# Patient Record
Sex: Female | Born: 1990 | Race: Black or African American | Hispanic: No | Marital: Single | Smoking: Never smoker
Health system: Southern US, Community
[De-identification: ages and names within clinical notes are randomized; demographics above are authoritative.]

## PROBLEM LIST (undated history)

## (undated) DIAGNOSIS — F32A Depression, unspecified: Secondary | ICD-10-CM

## (undated) DIAGNOSIS — F329 Major depressive disorder, single episode, unspecified: Secondary | ICD-10-CM

## (undated) DIAGNOSIS — IMO0002 Reserved for concepts with insufficient information to code with codable children: Secondary | ICD-10-CM

## (undated) DIAGNOSIS — R51 Headache: Secondary | ICD-10-CM

## (undated) HISTORY — PX: WISDOM TOOTH EXTRACTION: SHX21

---
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
## 2011-05-18 ENCOUNTER — Emergency Department (HOSPITAL_COMMUNITY)
Admission: EM | Admit: 2011-05-18 | Discharge: 2011-05-19 | Disposition: A | Discharge status: Home / Self Care | Payer: BC Managed Care – PPO | Attending: Emergency Medicine | Admitting: Emergency Medicine

## 2011-05-18 ENCOUNTER — Emergency Department (HOSPITAL_COMMUNITY): Payer: BC Managed Care – PPO

## 2011-05-18 DIAGNOSIS — S61409A Unspecified open wound of unspecified hand, initial encounter: Secondary | ICD-10-CM | POA: Insufficient documentation

## 2011-05-18 DIAGNOSIS — F3289 Other specified depressive episodes: Secondary | ICD-10-CM | POA: Insufficient documentation

## 2011-05-18 DIAGNOSIS — X789XXA Intentional self-harm by unspecified sharp object, initial encounter: Secondary | ICD-10-CM | POA: Insufficient documentation

## 2011-05-18 DIAGNOSIS — F329 Major depressive disorder, single episode, unspecified: Secondary | ICD-10-CM | POA: Insufficient documentation

## 2011-05-18 DIAGNOSIS — R45851 Suicidal ideations: Secondary | ICD-10-CM | POA: Insufficient documentation

## 2011-05-18 LAB — URINALYSIS, ROUTINE W REFLEX MICROSCOPIC
Glucose, UA: NEGATIVE mg/dL
Hgb urine dipstick: NEGATIVE
Ketones, ur: 40 mg/dL — AB
Nitrite: NEGATIVE
Protein, ur: 30 mg/dL — AB
Specific Gravity, Urine: 1.03 (ref 1.005–1.030)
Urobilinogen, UA: 1 mg/dL (ref 0.0–1.0)
pH: 6 (ref 5.0–8.0)

## 2011-05-18 LAB — DIFFERENTIAL
Basophils Absolute: 0 10*3/uL (ref 0.0–0.1)
Basophils Relative: 1 % (ref 0–1)
Eosinophils Absolute: 0 10*3/uL (ref 0.0–0.7)
Eosinophils Relative: 0 % (ref 0–5)
Lymphocytes Relative: 24 % (ref 12–46)
Lymphs Abs: 1.2 10*3/uL (ref 0.7–4.0)
Monocytes Absolute: 0.4 10*3/uL (ref 0.1–1.0)
Monocytes Relative: 8 % (ref 3–12)
Neutro Abs: 3.6 10*3/uL (ref 1.7–7.7)
Neutrophils Relative %: 68 % (ref 43–77)

## 2011-05-18 LAB — RAPID URINE DRUG SCREEN, HOSP PERFORMED
Amphetamines: NOT DETECTED
Barbiturates: NOT DETECTED
Benzodiazepines: NOT DETECTED
Cocaine: NOT DETECTED
Opiates: NOT DETECTED
Tetrahydrocannabinol: NOT DETECTED

## 2011-05-18 LAB — URINE MICROSCOPIC-ADD ON

## 2011-05-18 LAB — POCT I-STAT, CHEM 8
BUN: 12 mg/dL (ref 6–23)
Calcium, Ion: 1.11 mmol/L — ABNORMAL LOW (ref 1.12–1.32)
Chloride: 109 mEq/L (ref 96–112)
Creatinine, Ser: 0.7 mg/dL (ref 0.50–1.10)
Glucose, Bld: 92 mg/dL (ref 70–99)
HCT: 42 % (ref 36.0–46.0)
Hemoglobin: 14.3 g/dL (ref 12.0–15.0)
Potassium: 3.5 mEq/L (ref 3.5–5.1)
Sodium: 140 mEq/L (ref 135–145)
TCO2: 19 mmol/L (ref 0–100)

## 2011-05-18 LAB — CBC
HCT: 37.9 % (ref 36.0–46.0)
Hemoglobin: 12.5 g/dL (ref 12.0–15.0)
MCH: 28.4 pg (ref 26.0–34.0)
MCHC: 33 g/dL (ref 30.0–36.0)
MCV: 86.1 fL (ref 78.0–100.0)
Platelets: 242 10*3/uL (ref 150–400)
RBC: 4.4 MIL/uL (ref 3.87–5.11)
RDW: 13.4 % (ref 11.5–15.5)
WBC: 5.2 10*3/uL (ref 4.0–10.5)

## 2011-05-18 LAB — PREGNANCY, URINE: Preg Test, Ur: NEGATIVE

## 2011-05-18 LAB — ETHANOL: Alcohol, Ethyl (B): <11 mg/dL (ref 0–11)

## 2011-06-25 ENCOUNTER — Ambulatory Visit (HOSPITAL_COMMUNITY): Payer: BC Managed Care – PPO | Admitting: Psychology

## 2011-07-23 ENCOUNTER — Ambulatory Visit (HOSPITAL_COMMUNITY): Payer: BC Managed Care – PPO | Admitting: Psychology

## 2011-07-27 DIAGNOSIS — IMO0002 Reserved for concepts with insufficient information to code with codable children: Secondary | ICD-10-CM

## 2011-07-27 DIAGNOSIS — IMO0001 Reserved for inherently not codable concepts without codable children: Secondary | ICD-10-CM

## 2011-07-27 HISTORY — DX: Reserved for concepts with insufficient information to code with codable children: IMO0002

## 2011-07-27 HISTORY — DX: Reserved for inherently not codable concepts without codable children: IMO0001

## 2011-08-10 ENCOUNTER — Ambulatory Visit (HOSPITAL_COMMUNITY): Payer: BC Managed Care – PPO | Admitting: Psychology

## 2011-08-12 ENCOUNTER — Other Ambulatory Visit: Payer: Self-pay | Admitting: Orthopedic Surgery

## 2011-08-12 ENCOUNTER — Encounter (HOSPITAL_BASED_OUTPATIENT_CLINIC_OR_DEPARTMENT_OTHER): Payer: Self-pay | Admitting: *Deleted

## 2011-08-13 ENCOUNTER — Ambulatory Visit (HOSPITAL_BASED_OUTPATIENT_CLINIC_OR_DEPARTMENT_OTHER): Payer: BC Managed Care – PPO | Admitting: Anesthesiology

## 2011-08-13 ENCOUNTER — Ambulatory Visit (HOSPITAL_BASED_OUTPATIENT_CLINIC_OR_DEPARTMENT_OTHER)
Admission: RE | Admit: 2011-08-13 | Discharge: 2011-08-13 | Disposition: A | Discharge status: Home / Self Care | Payer: BC Managed Care – PPO | Source: Ambulatory Visit | Attending: Orthopedic Surgery | Admitting: Orthopedic Surgery

## 2011-08-13 ENCOUNTER — Encounter (HOSPITAL_BASED_OUTPATIENT_CLINIC_OR_DEPARTMENT_OTHER): Payer: Self-pay | Admitting: Anesthesiology

## 2011-08-13 ENCOUNTER — Encounter (HOSPITAL_BASED_OUTPATIENT_CLINIC_OR_DEPARTMENT_OTHER)
Admission: RE | Disposition: A | Discharge status: Home / Self Care | Payer: Self-pay | Source: Ambulatory Visit | Attending: Orthopedic Surgery

## 2011-08-13 DIAGNOSIS — IMO0002 Reserved for concepts with insufficient information to code with codable children: Secondary | ICD-10-CM | POA: Insufficient documentation

## 2011-08-13 DIAGNOSIS — R51 Headache: Secondary | ICD-10-CM | POA: Insufficient documentation

## 2011-08-13 DIAGNOSIS — F3289 Other specified depressive episodes: Secondary | ICD-10-CM | POA: Insufficient documentation

## 2011-08-13 DIAGNOSIS — F329 Major depressive disorder, single episode, unspecified: Secondary | ICD-10-CM | POA: Insufficient documentation

## 2011-08-13 DIAGNOSIS — X58XXXA Exposure to other specified factors, initial encounter: Secondary | ICD-10-CM | POA: Insufficient documentation

## 2011-08-13 HISTORY — PX: ORIF FINGER FRACTURE: SHX2122

## 2011-08-13 HISTORY — DX: Reserved for concepts with insufficient information to code with codable children: IMO0002

## 2011-08-13 HISTORY — DX: Headache: R51

## 2011-08-13 HISTORY — DX: Major depressive disorder, single episode, unspecified: F32.9

## 2011-08-13 HISTORY — DX: Depression, unspecified: F32.A

## 2011-08-13 SURGERY — OPEN REDUCTION INTERNAL FIXATION (ORIF) METACARPAL (FINGER) FRACTURE
Anesthesia: General | Site: Finger | Laterality: Left | Wound class: Clean

## 2011-08-13 MED ORDER — MEPERIDINE HCL 25 MG/ML IJ SOLN: 6.2500 mg | INTRAMUSCULAR | Status: DC | PRN

## 2011-08-13 MED ORDER — LIDOCAINE HCL (CARDIAC) 20 MG/ML IV SOLN
INTRAVENOUS | Status: DC | PRN
  Administered 2011-08-13: 60 mg via INTRAVENOUS

## 2011-08-13 MED ORDER — DEXAMETHASONE SODIUM PHOSPHATE 10 MG/ML IJ SOLN
INTRAMUSCULAR | Status: DC | PRN
  Administered 2011-08-13: 10 mg via INTRAVENOUS

## 2011-08-13 MED ORDER — LIDOCAINE HCL 2 % IJ SOLN
INTRAMUSCULAR | Status: DC | PRN
  Administered 2011-08-13: 4 mL via INTRADERMAL

## 2011-08-13 MED ORDER — FENTANYL CITRATE 0.05 MG/ML IJ SOLN
25.0000 ug | INTRAMUSCULAR | Status: DC | PRN
  Administered 2011-08-13 (×4): 25 ug via INTRAVENOUS

## 2011-08-13 MED ORDER — FENTANYL CITRATE 0.05 MG/ML IJ SOLN
INTRAMUSCULAR | Status: DC | PRN
  Administered 2011-08-13 (×2): 25 ug via INTRAVENOUS
  Administered 2011-08-13: 100 ug via INTRAVENOUS
  Administered 2011-08-13 (×2): 25 ug via INTRAVENOUS

## 2011-08-13 MED ORDER — HYDROMORPHONE HCL PF 1 MG/ML IJ SOLN
0.2500 mg | INTRAMUSCULAR | Status: DC | PRN
  Administered 2011-08-13 (×2): 0.25 mg via INTRAVENOUS

## 2011-08-13 MED ORDER — LACTATED RINGERS IV SOLN
INTRAVENOUS | Status: DC
  Administered 2011-08-13 (×2): via INTRAVENOUS

## 2011-08-13 MED ORDER — ONDANSETRON HCL 4 MG/2ML IJ SOLN
INTRAMUSCULAR | Status: DC | PRN
  Administered 2011-08-13: 4 mg via INTRAVENOUS

## 2011-08-13 MED ORDER — CHLORHEXIDINE GLUCONATE 4 % EX LIQD: 60.0000 mL | Freq: Once | CUTANEOUS | Status: DC

## 2011-08-13 MED ORDER — HYDROMORPHONE HCL 2 MG PO TABS
2.0000 mg | ORAL_TABLET | Freq: Once | ORAL | Status: AC | PRN
  Administered 2011-08-13: 2 mg via ORAL

## 2011-08-13 MED ORDER — PROMETHAZINE HCL 25 MG/ML IJ SOLN: 6.2500 mg | INTRAMUSCULAR | Status: DC | PRN

## 2011-08-13 MED ORDER — MIDAZOLAM HCL 5 MG/5ML IJ SOLN
INTRAMUSCULAR | Status: DC | PRN
  Administered 2011-08-13: 2 mg via INTRAVENOUS

## 2011-08-13 MED ORDER — VANCOMYCIN HCL 1000 MG IV SOLR
750.0000 mg | INTRAVENOUS | Status: AC
  Administered 2011-08-13: 750 mg via INTRAVENOUS

## 2011-08-13 MED ORDER — PROPOFOL 10 MG/ML IV EMUL
INTRAVENOUS | Status: DC | PRN
  Administered 2011-08-13: 200 mg via INTRAVENOUS

## 2011-08-13 MED ORDER — HYDROMORPHONE HCL 2 MG PO TABS: 2.0000 mg | ORAL_TABLET | ORAL | Status: AC | PRN

## 2011-08-13 SURGICAL SUPPLY — 63 items
BANDAGE ADHESIVE 1X3 (GAUZE/BANDAGES/DRESSINGS) IMPLANT
BANDAGE ELASTIC 3 VELCRO ST LF (GAUZE/BANDAGES/DRESSINGS) ×4 IMPLANT
BANDAGE GAUZE ELAST BULKY 4 IN (GAUZE/BANDAGES/DRESSINGS) IMPLANT
BLADE MINI RND TIP GREEN BEAV (BLADE) ×2 IMPLANT
BLADE SURG 15 STRL LF DISP TIS (BLADE) ×1 IMPLANT
BLADE SURG 15 STRL SS (BLADE) ×2
BNDG CMPR 9X4 STRL LF SNTH (GAUZE/BANDAGES/DRESSINGS) ×1
BNDG CMPR MD 5X2 ELC HKLP STRL (GAUZE/BANDAGES/DRESSINGS) ×2
BNDG COHESIVE 1X5 TAN STRL LF (GAUZE/BANDAGES/DRESSINGS) IMPLANT
BNDG ELASTIC 2 VLCR STRL LF (GAUZE/BANDAGES/DRESSINGS) ×4 IMPLANT
BNDG ESMARK 4X9 LF (GAUZE/BANDAGES/DRESSINGS) ×2 IMPLANT
BRUSH SCRUB EZ PLAIN DRY (MISCELLANEOUS) ×2 IMPLANT
CANISTER SUCTION 1200CC (MISCELLANEOUS) IMPLANT
CLOTH BEACON ORANGE TIMEOUT ST (SAFETY) ×2 IMPLANT
CORDS BIPOLAR (ELECTRODE) ×2 IMPLANT
COVER MAYO STAND STRL (DRAPES) ×2 IMPLANT
COVER TABLE BACK 60X90 (DRAPES) ×2 IMPLANT
CUFF TOURNIQUET SINGLE 18IN (TOURNIQUET CUFF) ×2 IMPLANT
DECANTER SPIKE VIAL GLASS SM (MISCELLANEOUS) ×2 IMPLANT
DRAPE OEC MINIVIEW 54X84 (DRAPES) ×2 IMPLANT
DRAPE SURG 17X23 STRL (DRAPES) ×2 IMPLANT
GAUZE XEROFORM 1X8 LF (GAUZE/BANDAGES/DRESSINGS) ×2 IMPLANT
GLOVE BIO SURGEON STRL SZ 6.5 (GLOVE) ×2 IMPLANT
GLOVE BIOGEL M STRL SZ7.5 (GLOVE) ×4 IMPLANT
GLOVE BIOGEL PI IND STRL 7.0 (GLOVE) ×1 IMPLANT
GLOVE BIOGEL PI INDICATOR 7.0 (GLOVE) ×1
GLOVE ORTHO TXT STRL SZ7.5 (GLOVE) ×2 IMPLANT
GOWN PREVENTION PLUS XLARGE (GOWN DISPOSABLE) ×2 IMPLANT
GOWN STRL REIN XL XLG (GOWN DISPOSABLE) ×6 IMPLANT
NEEDLE 27GAX1X1/2 (NEEDLE) ×2 IMPLANT
NEEDLE KEITH SZ10 STRAIGHT (NEEDLE) ×2 IMPLANT
NS IRRIG 1000ML POUR BTL (IV SOLUTION) ×2 IMPLANT
PACK BASIN DAY SURGERY FS (CUSTOM PROCEDURE TRAY) ×2 IMPLANT
PAD CAST 3X4 CTTN HI CHSV (CAST SUPPLIES) IMPLANT
PAD CAST 4YDX4 CTTN HI CHSV (CAST SUPPLIES) IMPLANT
PADDING CAST ABS 4INX4YD NS (CAST SUPPLIES)
PADDING CAST ABS COTTON 4X4 ST (CAST SUPPLIES) IMPLANT
PADDING CAST COTTON 3X4 STRL (CAST SUPPLIES)
PADDING CAST COTTON 4X4 STRL (CAST SUPPLIES)
PADDING UNDERCAST 2  STERILE (CAST SUPPLIES) ×2 IMPLANT
SLEEVE SCD COMPRESS KNEE MED (MISCELLANEOUS) ×2 IMPLANT
SPLINT PLASTER CAST XFAST 3X15 (CAST SUPPLIES) ×14 IMPLANT
SPLINT PLASTER XTRA FASTSET 3X (CAST SUPPLIES) ×14
SPONGE GAUZE 4X4 12PLY (GAUZE/BANDAGES/DRESSINGS) IMPLANT
STOCKINETTE 4X48 STRL (DRAPES) ×2 IMPLANT
SUCTION FRAZIER TIP 10 FR DISP (SUCTIONS) IMPLANT
SUT ETHILON 4 0 PS 2 18 (SUTURE) IMPLANT
SUT FIBERWIRE 3-0 18 TAPR NDL (SUTURE) ×2
SUT MERSILENE 4 0 P 3 (SUTURE) IMPLANT
SUT POLY BUTTON 15MM (SUTURE) ×2 IMPLANT
SUT PROLENE 3 0 PS 2 (SUTURE) IMPLANT
SUT PROLENE 4 0 P 3 18 (SUTURE) ×2 IMPLANT
SUT VIC AB 4-0 P-3 18XBRD (SUTURE) IMPLANT
SUT VIC AB 4-0 P3 18 (SUTURE)
SUTURE FIBERWR 3-0 18 TAPR NDL (SUTURE) ×1 IMPLANT
SYR 3ML 23GX1 SAFETY (SYRINGE) IMPLANT
SYR BULB 3OZ (MISCELLANEOUS) ×2 IMPLANT
SYR CONTROL 10ML LL (SYRINGE) ×2 IMPLANT
TOWEL OR 17X24 6PK STRL BLUE (TOWEL DISPOSABLE) ×2 IMPLANT
TRAY DSU PREP LF (CUSTOM PROCEDURE TRAY) ×2 IMPLANT
TUBE CONNECTING 20X1/4 (TUBING) IMPLANT
UNDERPAD 30X30 INCONTINENT (UNDERPADS AND DIAPERS) ×2 IMPLANT
WATER STERILE IRR 1000ML POUR (IV SOLUTION) IMPLANT

## 2011-08-13 NOTE — Anesthesia Procedure Notes (Signed)
Procedure Name: LMA Insertion Date/Time: 08/13/2011 11:27 AM Performed by: Signa Kell Pre-anesthesia Checklist: Patient identified, Emergency Drugs available, Suction available and Patient being monitored Patient Re-evaluated:Patient Re-evaluated prior to inductionOxygen Delivery Method: Circle System Utilized Preoxygenation: Pre-oxygenation with 100% oxygen Intubation Type: IV induction Ventilation: Mask ventilation without difficulty LMA: LMA inserted LMA Size: 4.0 Number of attempts: 1 Airway Equipment and Method: bite block Placement Confirmation: positive ETCO2 Tube secured with: Tape Dental Injury: Teeth and Oropharynx as per pre-operative assessment

## 2011-08-13 NOTE — Op Note (Signed)
OP NOTE DICTATED: 213086 08/13/11

## 2011-08-13 NOTE — Anesthesia Postprocedure Evaluation (Signed)
  Anesthesia Post-op Note  Patient: Jennifer Patton  Procedure(s) Performed:  OPEN REDUCTION INTERNAL FIXATION (ORIF) METACARPAL (FINGER) FRACTURE - ORIF of Articular fracture  Patient Location: PACU  Anesthesia Type: General  Level of Consciousness: awake  Airway and Oxygen Therapy: Patient Spontanous Breathing  Post-op Pain: mild  Post-op Assessment: Post-op Vital signs reviewed, Patient's Cardiovascular Status Stable, Respiratory Function Stable and Patent Airway  Post-op Vital Signs: Reviewed and stable  Complications: No apparent anesthesia complications

## 2011-08-13 NOTE — H&P (Signed)
Jennifer Patton is an 21 y.o. female.   Chief Complaint: Complaining of injury to her left small finger HPI: Patient is a 21 year old right-hand-dominant female who is a Theatre stage manager at [employer]. she was engaged in a flight football game in [place] on [place] where she sustained an injury to her left small finger PIP joint. She noted immediate pain she proceeded to a local urgent care in [place]. X-rays were taken this revealed a dislocation of her PIP joint with a fracture of the Silastic of the proximal phalanx. The finger was reduced and buddy taped. She was instructed to followup with orthopedics when she returned to [facility]. She was seen by Dr. Tiburcio Pea on 08/11/2011 at that time she was referred to our office for further evaluation. X-ray examination in the office revealed that the finger was reduced but she had disruption of her of the plate was fractured the distal aspect of the proximal phalanx.  Past Medical History  Diagnosis Date  . Headache     allergy-related  . Depression   . Fracture/dislocation, finger, proximal/middle phalanx 07/2011    left small finger    Past Surgical History  Procedure Date  . Wisdom tooth extraction     History reviewed. No pertinent family history. Social History:  reports that she has never smoked. She has never used smokeless tobacco. She reports that she does not drink alcohol or use illicit drugs.  Allergies:  Allergies  Allergen Reactions  . Amoxicillin Swelling  . Penicillins Swelling  . Tylenol (Acetaminophen) Other (See Comments)    Makes her jittery    No current facility-administered medications on file as of 08/13/2011.   Medications Prior to Admission  Medication Sig Dispense Refill  . escitalopram (LEXAPRO) 20 MG tablet Take 20 mg by mouth daily.      . traMADol (ULTRAM) 50 MG tablet Take 50 mg by mouth daily. AM      . aspirin-acetaminophen-caffeine (EXCEDRIN MIGRAINE) 250-250-65 MG per tablet Take 1 tablet by mouth  every 6 (six) hours as needed.        No results found for this or any previous visit (from the past 48 hour(s)).  No results found.   Pertinent items are noted in HPI.  Blood pressure 109/67, pulse 72, temperature 98.8 F (37.1 C), temperature source Oral, resp. rate 16, height 5\' 1"  (1.549 m), weight 60.782 kg (134 lb), SpO2 99.00%.  General appearance: alert Head: Normocephalic, without obvious abnormality Neck: supple, symmetrical, trachea midline Resp: clear to auscultation bilaterally Cardio: regular rate and rhythm, S1, S2 normal, no murmur, click, rub or gallop GI: normal findings: bowel sounds normal Extremities: Emanation of her left hand reveals obvious swelling of the left small finger with pain to palpation at the PIP joint. She has very limited range of motion and. We reviewed her x-rays this revealed a fracture of the distal aspect of the proximal phalanx and suction of volar plate Pulses: 2+ and symmetric Skin: normal Neurologic: Grossly normal :   Assessment/Plan Impression: Status post fracture dislocation PIP joint left small finger with PIP joint currently reduced  Plan: Patient to take the operating room to undergo open reduction internal with interosseous wire/suture of the PIP joint/volar plate and middle phalanx/proximal phalanx left small finger.  DASNOIT,Robie Mcniel J 08/13/2011, 10:30 AM    H&P documentation: 08/13/2011  -History and Physical Reviewed  -Patient has been re-examined  -No change in the plan of care  Wyn Forster, MD

## 2011-08-13 NOTE — Transfer of Care (Signed)
Immediate Anesthesia Transfer of Care Note  Patient: Jennifer Patton  Procedure(s) Performed:  OPEN REDUCTION INTERNAL FIXATION (ORIF) METACARPAL (FINGER) FRACTURE - ORIF of Articular fracture  Patient Location: PACU  Anesthesia Type: General  Level of Consciousness: sedated  Airway & Oxygen Therapy: Patient Spontanous Breathing and Patient connected to face mask oxygen  Post-op Assessment: Report given to PACU RN and Post -op Vital signs reviewed and stable  Post vital signs: Reviewed and stable Filed Vitals:   08/13/11 1014  BP: 109/67  Pulse: 72  Temp: 37.1 C  Resp: 16    Complications: No apparent anesthesia complications

## 2011-08-13 NOTE — Anesthesia Preprocedure Evaluation (Signed)
Anesthesia Evaluation  Patient identified by MRN, date of birth, ID band Patient awake    Reviewed: Allergy & Precautions, H&P , NPO status , Patient's Chart, lab work & pertinent test results  Airway Mallampati: II TM Distance: >3 FB Neck ROM: Full    Dental No notable dental hx. (+) Teeth Intact   Pulmonary neg pulmonary ROS,  clear to auscultation  Pulmonary exam normal       Cardiovascular neg cardio ROS Regular Normal    Neuro/Psych Negative Neurological ROS  Negative Psych ROS   GI/Hepatic negative GI ROS, Neg liver ROS,   Endo/Other  Negative Endocrine ROS  Renal/GU negative Renal ROS  Genitourinary negative   Musculoskeletal   Abdominal   Peds  Hematology negative hematology ROS (+)   Anesthesia Other Findings   Reproductive/Obstetrics negative OB ROS                           Anesthesia Physical Anesthesia Plan  ASA: II  Anesthesia Plan: General   Post-op Pain Management:    Induction: Intravenous  Airway Management Planned: LMA  Additional Equipment:   Intra-op Plan:   Post-operative Plan: Extubation in OR  Informed Consent: I have reviewed the patients History and Physical, chart, labs and discussed the procedure including the risks, benefits and alternatives for the proposed anesthesia with the patient or authorized representative who has indicated his/her understanding and acceptance.     Plan Discussed with: CRNA  Anesthesia Plan Comments:         Anesthesia Quick Evaluation  

## 2011-08-13 NOTE — Brief Op Note (Signed)
08/13/2011  12:31 PM  PATIENT:  Delorse Lek  21 y.o. female  PRE-OPERATIVE DIAGNOSIS:  fracture  left small finger proximal interphalangeal joint volar middle phalangeal articular surface  POST-OPERATIVE DIAGNOSIS:  fracture  left small finger proximal interphalangeal joint volar middle phalangeal articular surface  PROCEDURE:  Procedure(s): OPEN REDUCTION INTERNAL FIXATION (ORIF)  FRACTURE OF LEFT SMALL FINGER MIDDLLE PHALANX VOLAR ARTICULAR SURFACE  SURGEON:  Surgeon(s): Wyn Forster., MD  PHYSICIAN ASSISTANT:   ASSISTANTS: Mallory Shirk.A-C    ANESTHESIA:   general  EBL:  Total I/O In: 200 [I.V.:200] Out: -   BLOOD ADMINISTERED:none  DRAINS: none   LOCAL MEDICATIONS USED:  LIDOCAINE 4CC 2 %  SPECIMEN:  No Specimen  DISPOSITION OF SPECIMEN:  N/A  COUNTS:  YES  TOURNIQUET:  * Missing tourniquet times found for documented tourniquets in log:  [id number] *  DICTATION: .Other Dictation: Dictation Number [id number]  PLAN OF CARE: Discharge to home after PACU  PATIENT DISPOSITION:  PACU - hemodynamically stable.

## 2011-08-14 NOTE — Op Note (Signed)
Jennifer Patton, Jennifer Patton           ACCOUNT NO.:  [id number]  MEDICAL RECORD NO.:  [id number]  LOCATION:  [facility]                         FACILITY:  [facility]  PHYSICIAN:  Katy Fitch. Everett Ehrler, M.D. DATE OF BIRTH:  12-01-1990  DATE OF PROCEDURE:  08/13/2011 DATE OF DISCHARGE:  05/19/2011                              OPERATIVE REPORT   PREOPERATIVE DIAGNOSES:  Complex articular fracture, left small finger, middle phalanx at Proximal interphalangeal joint, following fracture dislocation sustained playing flag football 1 week prior.  POSTOPERATIVE DIAGNOSES:  Complex articular fracture, left small finger, middle phalanx at Proximal interphalangeal joint, following fracture dislocation sustained playing flag football 1 week prior.  OPERATIONS:  Open reduction and internal fixation of left small finger, middle phalangeal volar articular surface fracture with 3-0 FiberWire pullout suture.  SURGEON:  Katy Fitch. Jennifer Mcelveen, MD  ASSISTANT:  Marveen Reeks. Dasnoit, PA-C.  ANESTHESIA:  General by LMA supplemented by a digital block.  SUPERVISING ANESTHESIOLOGIST:  Zenon Mayo, MD  INDICATIONS:  Jennifer Patton is at [age]-year-old, [place] [employer].  She is referred through the courtesy of Dr. Holley Bouche of [facility] for evaluation and management of a complex left small finger injury  While playing flag football on August 04, 2010, visiting [place], [place], she sustained a complex fracture dislocation of the finger.  She was seen at an emergency facility where her finger was reduced without anesthesia.  A congruous reduction was achieved.  She was unable to flex or extend the finger due to marked pain and marked swelling.  She returned to see Dr. Tiburcio Pea who reviewed the x-rays and noted the fracture predicament. She was referred for an upper extremity orthopedic consult.  Examination of her x-rays suggested that she had a volar  radial articular fracture fragment with a large ostial cartilaginous critical corner fracture.  She was congruously reduced.  We could not significantly test her collateral ligament stability due to her degree of discomfort.  Based on her x-rays, it did appear that she had a very significant articular injury.  Therefore, we recommended open reduction and internal fixation of her joint fracture fragment.  We had a detailed informed consent in our office and made arrangements for surgery at this time.  She understands with an intraarticular fracture, she is at risk to develop post injury stiffness that will take months perhaps 6 months to work out.  She is advised that her finger will never be the same size as it was prior to injury.  Furthermore, it is possible that she will develop posttraumatic arthrosis despite our best efforts to obtain and maintain an anatomic reduction of her articular fracture fragment.  With Jaonna's mother present, we provided detailed informed consent and recommended open reduction and internal fixation at this time.  Questions regarding the anticipated surgery were invited and answered in detail.  Preoperatively, she was noted to be allergic to amoxicillin, penicillin and intolerant of Tylenol.  We will provide postoperative Dilaudid as a primary analgesic and suggested she use ibuprofen as necessary.  Questions regarding her anticipated anesthesia were invited and answered in detail by Dr. Sampson Goon.  He recommended general anesthesia by LMA technique which we  will supplement with a postoperative digital block.  PROCEDURE IN DETAIL:  Jennifer Patton was brought to room 1 of the [facility] and placed in supine position on the operating table.  Under Dr. Jarrett Ables direct supervision, general anesthesia by LMA technique was induced.  Her left arm and hand were prepped with Betadine soap solution and sterilely draped, 750 mg of  vancomycin was administered as an IV prophylactic antibiotic.  Procedure commenced with routine surgical time-out.  Left arm was exsanguinated with Esmarch bandage and arterial tourniquet on proximal brachium inflated to 220 mmHg.  Based on reviewing the fracture morphology on the x-rays, it appeared that this would be appropriately approached through a mid lateral incision placed on the radial aspect of the finger.  This was accomplished sharply followed by identification of the radial neurovascular bundle and the transverse retinacular fibers.  The transverse retinacular fibers were partially ruptured and they were released distally to allow exposure of the radial collateral ligament.  The radial collateral ligament had reduced and appeared to be intact both proximally and distally.  The fracture fragment was trapped deep to the flexor tendons and was rotated out of alignment by 90 degrees proximally.  Careful inspection of the volar plate and fracture fragment revealed that this was 90% from radial to ulnar and involved about 1/3rd of the articular surface.  This was essentially a volar plate fracture/major critical corner-type injury that would lead to probable instability of the PIP joint.  Therefore we elected to proceed with a pullout suture repair carefully tunneling behind the superficialis, profundus tendons placing a mattress sutures through the bony fragment with drill holes created with a 35K-wire followed by very careful tunneling of suture across the middle phalanx of the dorsum of the finger.  We cannot help but trap the terminal extensor tendon slip  with this type of repair however what we did was pad or button use dorsally with 6 thicknesses of Xeroflo followed by reduction of the fracture fragment under direct vision and tensioning of the pullout suture.  Care was taken to avoid the neurovascular bundles and the flexor tendons.  The articular surface was  reapproximated.  The PIP joint was stable. Ms. Donovan was noted to have extension to neutral and further flexion to 95 degrees without apparent blocking.  It appears that both the radial and collateral ligaments are essentially sound.  The wound was then irrigated followed by closure of the skin with intradermal 4-0 Prolene.  Ms. Renwick was placed in a safe position splint with her PIP joint in full extension.  The tourniquet was released with immediate capillary refill to the fingers.  There were no apparent complications.  For aftercare, she was provided prescription for Dilaudid 2 mg 1 tablet p.o. q.3-4 h. p.r.n. pain, 24 tablets without refill.  She was also encouraged to use Advil for minor pain and may take Advil with the Dilaudid.  We will see her back for followup in our office in 1 week or sooner p.r.n. problems.     Katy Fitch Jasiyah Paulding, M.D.     RVS/MEDQ  D:  08/13/2011  T:  08/14/2011  Job:  [id number]  cc:   Holley Bouche, M.D.

## 2011-08-16 ENCOUNTER — Encounter (HOSPITAL_BASED_OUTPATIENT_CLINIC_OR_DEPARTMENT_OTHER): Payer: Self-pay | Admitting: Orthopedic Surgery

## 2011-08-16 LAB — POCT HEMOGLOBIN-HEMACUE: Hemoglobin: 12.2 g/dL (ref 12.0–15.0)

## 2012-11-15 ENCOUNTER — Emergency Department (HOSPITAL_COMMUNITY)
Admission: EM | Admit: 2012-11-15 | Discharge: 2012-11-15 | Disposition: A | Discharge status: Home / Self Care | Payer: BC Managed Care – PPO | Attending: Emergency Medicine | Admitting: Emergency Medicine

## 2012-11-15 ENCOUNTER — Encounter (HOSPITAL_COMMUNITY): Payer: Self-pay | Admitting: *Deleted

## 2012-11-15 DIAGNOSIS — C221 Intrahepatic bile duct carcinoma: Secondary | ICD-10-CM | POA: Insufficient documentation

## 2012-11-15 DIAGNOSIS — R110 Nausea: Secondary | ICD-10-CM | POA: Insufficient documentation

## 2012-11-15 DIAGNOSIS — R5383 Other fatigue: Secondary | ICD-10-CM | POA: Insufficient documentation

## 2012-11-15 DIAGNOSIS — R5381 Other malaise: Secondary | ICD-10-CM | POA: Insufficient documentation

## 2012-11-15 DIAGNOSIS — Z3202 Encounter for pregnancy test, result negative: Secondary | ICD-10-CM | POA: Insufficient documentation

## 2012-11-15 DIAGNOSIS — E860 Dehydration: Secondary | ICD-10-CM | POA: Insufficient documentation

## 2012-11-15 DIAGNOSIS — R55 Syncope and collapse: Secondary | ICD-10-CM | POA: Insufficient documentation

## 2012-11-15 DIAGNOSIS — Z880 Allergy status to penicillin: Secondary | ICD-10-CM | POA: Insufficient documentation

## 2012-11-15 DIAGNOSIS — R197 Diarrhea, unspecified: Secondary | ICD-10-CM | POA: Insufficient documentation

## 2012-11-15 DIAGNOSIS — Z8659 Personal history of other mental and behavioral disorders: Secondary | ICD-10-CM | POA: Insufficient documentation

## 2012-11-15 DIAGNOSIS — I951 Orthostatic hypotension: Secondary | ICD-10-CM

## 2012-11-15 LAB — URINALYSIS, ROUTINE W REFLEX MICROSCOPIC
Bilirubin Urine: NEGATIVE
Glucose, UA: NEGATIVE mg/dL
Hgb urine dipstick: NEGATIVE
Ketones, ur: 40 mg/dL — AB
Nitrite: NEGATIVE
Protein, ur: 30 mg/dL — AB
Specific Gravity, Urine: 1.037 — ABNORMAL HIGH (ref 1.005–1.030)
Urobilinogen, UA: 1 mg/dL (ref 0.0–1.0)
pH: 6.5 (ref 5.0–8.0)

## 2012-11-15 LAB — COMPREHENSIVE METABOLIC PANEL
ALT: 13 U/L (ref 0–35)
AST: 19 U/L (ref 0–37)
Albumin: 4.4 g/dL (ref 3.5–5.2)
Alkaline Phosphatase: 68 U/L (ref 39–117)
BUN: 14 mg/dL (ref 6–23)
CO2: 24 mEq/L (ref 19–32)
Calcium: 9.5 mg/dL (ref 8.4–10.5)
Chloride: 104 mEq/L (ref 96–112)
Creatinine, Ser: 0.74 mg/dL (ref 0.50–1.10)
GFR calc Af Amer: >90 mL/min (ref >90)
GFR calc non Af Amer: >90 mL/min (ref >90)
Glucose, Bld: 84 mg/dL (ref 70–99)
Potassium: 3.3 mEq/L — ABNORMAL LOW (ref 3.5–5.1)
Sodium: 140 mEq/L (ref 135–145)
Total Bilirubin: 0.8 mg/dL (ref 0.3–1.2)
Total Protein: 7.7 g/dL (ref 6.0–8.3)

## 2012-11-15 LAB — CBC
HCT: 37.9 % (ref 36.0–46.0)
Hemoglobin: 12.7 g/dL (ref 12.0–15.0)
MCH: 27.4 pg (ref 26.0–34.0)
MCHC: 33.5 g/dL (ref 30.0–36.0)
MCV: 81.7 fL (ref 78.0–100.0)
Platelets: 234 10*3/uL (ref 150–400)
RBC: 4.64 MIL/uL (ref 3.87–5.11)
RDW: 13.3 % (ref 11.5–15.5)
WBC: 5.3 10*3/uL (ref 4.0–10.5)

## 2012-11-15 LAB — URINE MICROSCOPIC-ADD ON

## 2012-11-15 LAB — PREGNANCY, URINE: Preg Test, Ur: NEGATIVE

## 2012-11-15 LAB — POCT I-STAT TROPONIN I: Troponin i, poc: 0 ng/mL (ref 0.00–0.08)

## 2012-11-15 MED ORDER — ONDANSETRON 4 MG PO TBDP: 4.0000 mg | ORAL_TABLET | Freq: Three times a day (TID) | ORAL | Status: DC | PRN

## 2012-11-15 MED ORDER — ONDANSETRON 4 MG PO TBDP
8.0000 mg | ORAL_TABLET | Freq: Once | ORAL | Status: AC
  Administered 2012-11-15: 8 mg via ORAL
  Filled 2012-11-15: qty 2

## 2012-11-15 NOTE — ED Provider Notes (Signed)
History     CSN: [id number]  Arrival date & time 11/15/12  1159   First MD Initiated Contact with Patient 11/15/12 1201      Chief Complaint  Patient presents with  . Loss of Consciousness    (Consider location/radiation/quality/duration/timing/severity/associated sxs/prior treatment) HPI  Patient reports all week she has not felt well. She has not been able to eat or drink. She has had some nausea without vomiting. She states she did have some diarrhea once yesterday. She states when she woke up this morning she did not feel well and she felt weak and tired. She was having nausea. She had to give a presentation in class and after standing for about 5 minutes she started getting weak, nauseated, and felt hot and she did pass out. She denies any injury from the fall from passing out. Per EMS her teacher caught her as she was passing out and she was out for 5 minutes.  She denies any urinary symptoms. She states that she is on Depo-Provera and she normally only has spotting now and then began. She states her last injection was in February and she had mild spotting at the end of February. She's G0 P0. Area   PCP Dr Tiburcio Pea   Past Medical History  Diagnosis Date  . Headache     allergy-related  . Depression   . Fracture/dislocation, finger, proximal/middle phalanx 07/2011    left small finger    Past Surgical History  Procedure Laterality Date  . Wisdom tooth extraction    . Orif finger fracture  08/13/2011    Procedure: OPEN REDUCTION INTERNAL FIXATION (ORIF) METACARPAL (FINGER) FRACTURE;  Surgeon: Wyn Forster., MD;  Location: [facility];  Service: Orthopedics;  Laterality: Left;  ORIF of Articular fracture    No family history on file.  History  Substance Use Topics  . Smoking status: Never Smoker   . Smokeless tobacco: Never Used  . Alcohol Use: No   college student  OB History   Grav Para Term Preterm Abortions TAB SAB Ect Mult Living                   Review of Systems  All other systems reviewed and are negative.    Allergies  Amoxicillin; Penicillins; and Tylenol  Home Medications   Current Outpatient Rx  Name  Route  Sig  Dispense  Refill  . aspirin-acetaminophen-caffeine (EXCEDRIN MIGRAINE) 250-250-65 MG per tablet   Oral   Take 1 tablet by mouth every 6 (six) hours as needed for pain.          Marland Kitchen ibuprofen (ADVIL,MOTRIN) 200 MG tablet   Oral   Take 400 mg by mouth every 6 (six) hours as needed for pain.           ED Triage Vitals  Enc Vitals Group     BP 11/15/12 1225 106/57 mmHg     Pulse Rate 11/15/12 1225 72     Resp 11/15/12 1225 18     Temp 11/15/12 1225 98.7 F (37.1 C)     Temp src 11/15/12 1225 Oral     SpO2 11/15/12 1225 99 %     Weight 11/15/12 1225 160 lb (72.576 kg)     Height 11/15/12 1225 5\' 1"  (1.549 m)     Head Cir --      Peak Flow --      Pain Score --      Pain Loc --  Pain Edu? --      Excl. in GC? --    Vital signs normal   Orthostatic vital signs show pulse went from 69 lying to 101 standing with blood pressure 100/57 to 114/65 standing   Physical Exam  Nursing note and vitals reviewed. Constitutional: She is oriented to person, place, and time. She appears well-developed and well-nourished.  Non-toxic appearance. She does not appear ill. No distress.  HENT:  Head: Normocephalic and atraumatic.  Right Ear: External ear normal.  Left Ear: External ear normal.  Nose: Nose normal. No mucosal edema or rhinorrhea.  Mouth/Throat: Oropharynx is clear and moist and mucous membranes are normal. No dental abscesses or edematous.  Dry tongue  Eyes: Conjunctivae and EOM are normal. Pupils are equal, round, and reactive to light.  Neck: Normal range of motion and full passive range of motion without pain. Neck supple.  Cardiovascular: Normal rate, regular rhythm and normal heart sounds.  Exam reveals no gallop and no friction rub.   No murmur heard. Pulmonary/Chest: Effort  normal and breath sounds normal. No respiratory distress. She has no wheezes. She has no rhonchi. She has no rales. She exhibits no tenderness and no crepitus.  Abdominal: Soft. Normal appearance and bowel sounds are normal. She exhibits no distension. There is no tenderness. There is no rebound and no guarding.  Musculoskeletal: Normal range of motion. She exhibits no edema and no tenderness.  Moves all extremities well.   Neurological: She is alert and oriented to person, place, and time. She has normal strength. No cranial nerve deficit.  Skin: Skin is warm, dry and intact. No rash noted. No erythema. No pallor.  Psychiatric: She has a normal mood and affect. Her speech is normal and behavior is normal. Her mood appears not anxious.    ED Course  Procedures (including critical care time) Medications  ondansetron (ZOFRAN-ODT) disintegrating tablet 8 mg (8 mg Oral Given 11/15/12 1437)     Patient refused to have IV fluids done in the ED.  Patient drink some fluids in the ED however her blood pressure did dip into the 80s and 90s. Patient however still refuses IV placement. She is also insisting to go home. MOP has been in the room and hasn't been able to convince patient to get IV fluids or stay longer.    Results for orders placed during the hospital encounter of 11/15/12  CBC      Result Value Range   WBC 5.3  4.0 - 10.5 K/uL   RBC 4.64  3.87 - 5.11 MIL/uL   Hemoglobin 12.7  12.0 - 15.0 g/dL   HCT 16.1  09.6 - 04.5 %   MCV 81.7  78.0 - 100.0 fL   MCH 27.4  26.0 - 34.0 pg   MCHC 33.5  30.0 - 36.0 g/dL   RDW 40.9  81.1 - 91.4 %   Platelets 234  150 - 400 K/uL  COMPREHENSIVE METABOLIC PANEL      Result Value Range   Sodium 140  135 - 145 mEq/L   Potassium 3.3 (*) 3.5 - 5.1 mEq/L   Chloride 104  96 - 112 mEq/L   CO2 24  19 - 32 mEq/L   Glucose, Bld 84  70 - 99 mg/dL   BUN 14  6 - 23 mg/dL   Creatinine, Ser 7.82  0.50 - 1.10 mg/dL   Calcium 9.5  8.4 - 95.6 mg/dL   Total  Protein 7.7  6.0 - 8.3 g/dL  Albumin 4.4  3.5 - 5.2 g/dL   AST 19  0 - 37 U/L   ALT 13  0 - 35 U/L   Alkaline Phosphatase 68  39 - 117 U/L   Total Bilirubin 0.8  0.3 - 1.2 mg/dL   GFR calc non Af Amer >90  >90 mL/min   GFR calc Af Amer >90  >90 mL/min  URINALYSIS, ROUTINE W REFLEX MICROSCOPIC      Result Value Range   Color, Urine YELLOW  YELLOW   APPearance CLOUDY (*) CLEAR   Specific Gravity, Urine 1.037 (*) 1.005 - 1.030   pH 6.5  5.0 - 8.0   Glucose, UA NEGATIVE  NEGATIVE mg/dL   Hgb urine dipstick NEGATIVE  NEGATIVE   Bilirubin Urine NEGATIVE  NEGATIVE   Ketones, ur 40 (*) NEGATIVE mg/dL   Protein, ur 30 (*) NEGATIVE mg/dL   Urobilinogen, UA 1.0  0.0 - 1.0 mg/dL   Nitrite NEGATIVE  NEGATIVE   Leukocytes, UA MODERATE (*) NEGATIVE  PREGNANCY, URINE      Result Value Range   Preg Test, Ur NEGATIVE  NEGATIVE  URINE MICROSCOPIC-ADD ON      Result Value Range   Squamous Epithelial / LPF MANY (*) RARE   WBC, UA 7-10  <3 WBC/hpf   Bacteria, UA FEW (*) RARE   Urine-Other MUCOUS PRESENT    POCT I-STAT TROPONIN I      Result Value Range   Troponin i, poc 0.00  0.00 - 0.08 ng/mL   Comment 3            Laboratory interpretation all normal except concentrated urine consistent with dehydration, contaminated urine, mild hypokalemia     No results found.    Date: 11/15/2012  Rate: 73  Rhythm: normal sinus rhythm  QRS Axis: normal  Intervals: normal  ST/T Wave abnormalities: normal  Conduction Disutrbances:none  Narrative Interpretation: PAC's  Old EKG Reviewed: none available    1. Syncope   2. Orthostatic syncope   3. Dehydration   4. Nausea      New Prescriptions   ONDANSETRON (ZOFRAN ODT) 4 MG DISINTEGRATING TABLET    Take 1 tablet (4 mg total) by mouth every 8 (eight) hours as needed for nausea.    Plan discharge   Devoria Albe, MD, FACEP    MDM          Ward Givens, MD 11/15/12 [phone]

## 2012-11-15 NOTE — ED Notes (Signed)
Pt continues to refuse IV.  Notified that we need to push PO fluids for dehydration (pt tolerated 300 ml juice).  Pt states she does not want to stay. Dr Lynelle Doctor notified.

## 2012-11-15 NOTE — ED Notes (Signed)
Pt undressed, in gown, on monitor, continuous pulse oximetry and blood pressure cuff 

## 2012-11-15 NOTE — Discharge Instructions (Signed)
You are dehydrated. This is why you passed out. You are at risk for passing out now because you did not want to get the IV fluid therapy. Go home and rest. Drink a bottle of Gatorade tonight and another in the morning. Uses Zofran for nausea. Recheck if you feel worse.   Dehydration, Adult Dehydration is when you lose more fluids from the body than you take in. Vital organs like the kidneys, brain, and heart cannot function without a proper amount of fluids and salt. Any loss of fluids from the body can cause dehydration.  CAUSES   Vomiting.  Diarrhea.  Excessive sweating.  Excessive urine output.  Fever. SYMPTOMS  Mild dehydration  Thirst.  Dry lips.  Slightly dry mouth. Moderate dehydration  Very dry mouth.  Sunken eyes.  Skin does not bounce back quickly when lightly pinched and released.  Dark urine and decreased urine production.  Decreased tear production.  Headache. Severe dehydration  Very dry mouth.  Extreme thirst.  Rapid, weak pulse (more than 100 beats per minute at rest).  Cold hands and feet.  Not able to sweat in spite of heat and temperature.  Rapid breathing.  Blue lips.  Confusion and lethargy.  Difficulty being awakened.  Minimal urine production.  No tears. DIAGNOSIS  Your caregiver will diagnose dehydration based on your symptoms and your exam. Blood and urine tests will help confirm the diagnosis. The diagnostic evaluation should also identify the cause of dehydration. TREATMENT  Treatment of mild or moderate dehydration can often be done at home by increasing the amount of fluids that you drink. It is best to drink small amounts of fluid more often. Drinking too much at one time can make vomiting worse. Refer to the home care instructions below. Severe dehydration needs to be treated at the hospital where you will probably be given intravenous (IV) fluids that contain water and electrolytes. HOME CARE INSTRUCTIONS   Ask your  caregiver about specific rehydration instructions.  Drink enough fluids to keep your urine clear or pale yellow.  Drink small amounts frequently if you have nausea and vomiting.  Eat as you normally do.  Avoid:  Foods or drinks high in sugar.  Carbonated drinks.  Juice.  Extremely hot or cold fluids.  Drinks with caffeine.  Fatty, greasy foods.  Alcohol.  Tobacco.  Overeating.  Gelatin desserts.  Wash your hands well to avoid spreading bacteria and viruses.  Only take over-the-counter or prescription medicines for pain, discomfort, or fever as directed by your caregiver.  Ask your caregiver if you should continue all prescribed and over-the-counter medicines.  Keep all follow-up appointments with your caregiver. SEEK MEDICAL CARE IF:  You have abdominal pain and it increases or stays in one area (localizes).  You have a rash, stiff neck, or severe headache.  You are irritable, sleepy, or difficult to awaken.  You are weak, dizzy, or extremely thirsty. SEEK IMMEDIATE MEDICAL CARE IF:   You are unable to keep fluids down or you get worse despite treatment.  You have frequent episodes of vomiting or diarrhea.  You have blood or green matter (bile) in your vomit.  You have blood in your stool or your stool looks black and tarry.  You have not urinated in 6 to 8 hours, or you have only urinated a small amount of very dark urine.  You have a fever.  You faint. MAKE SURE YOU:   Understand these instructions.  Will watch your condition.  Will get help  right away if you are not doing well or get worse. Document Released: 07/12/2005 Document Revised: 10/04/2011 Document Reviewed: 03/01/2011 [facility] Patient Information 2013 [place], [place].  Clear Liquid Diet The clear liquid dietconsists of foods that are liquid or will become liquid at room temperature.You should be able to see through the liquid and beverages. Examples of foods allowed on a clear  liquid diet include fruit juice, broth or bouillon, gelatin, or frozen ice pops. The purpose of this diet is to provide necessary fluid, electrolytes such as sodium and potassium, and energy to keep the body functioning during times when you are not able to consume a regular diet.A clear liquid diet should not be continued for long periods of time as it is not nutritionally adequate.  REASONS FOR USING A CLEAR LIQUID DIET  In sudden onset (acute) conditions for a patient before or after surgery.  As the first step in oral feeding.  For fluid and electrolyte replacement in diarrheal diseases.  As a diet before certain medical tests are performed. ADEQUACY The clear liquid diet is adequate only in ascorbic acid, according to the Recommended Dietary Allowances of the [employer]. CHOOSING FOODS Breads and Starches  Allowed:  None are allowed.  Avoid: All are avoided. Vegetables  Allowed:  Strained tomato or vegetable juice.  Avoid: Any others. Fruit  Allowed:  Strained fruit juices and fruit drinks. Include 1 serving of citrus or vitamin C-enriched fruit juice daily.  Avoid: Any others. Meat and Meat Substitutes  Allowed:  None are allowed.  Avoid: All are avoided. Milk  Allowed:  None are allowed.  Avoid: All are avoided. Soups and Combination Foods  Allowed:  Clear bouillon, broth, or strained broth-based soups.  Avoid: Any others. Desserts and Sweets  Allowed:  Sugar, honey. High protein gelatin. Flavored gelatin, ices, or frozen ice pops that do not contain milk.  Avoid: Any others. Fats and Oils  Allowed:  None are allowed.  Avoid: All are avoided. Beverages  Allowed: Cereal beverages, coffee (regular or decaffeinated), tea, or soda at the discretion of your caregiver.  Avoid: Any others. Condiments  Allowed:  Iodized salt.  Avoid: Any others, including pepper. Supplements  Allowed:  Liquid nutrition beverages.  Avoid: Any others  that contain lactose or fiber. SAMPLE MEAL PLAN Breakfast  4 oz (120 mL) strained orange juice.   to 1 cup (125 to 250 mL) gelatin (plain or fortified).  1 cup (250 mL) beverage (coffee or tea).  Sugar, if desired. Midmorning Snack   cup (125 mL) gelatin (plain or fortified). Lunch  1 cup (250 mL) broth or consomm.  4 oz (120 mL) strained grapefruit juice.   cup (125 mL) gelatin (plain or fortified).  1 cup (250 mL) beverage (coffee or tea).  Sugar, if desired. Midafternoon Snack   cup (125 mL) fruit ice.   cup (125 mL) strained fruit juice. Dinner  1 cup (250 mL) broth or consomm.   cup (125 mL) cranberry juice.   cup (125 mL) flavored gelatin (plain or fortified).  1 cup (250 mL) beverage (coffee or tea).  Sugar, if desired. Evening Snack  4 oz (120 mL) strained apple juice (vitamin C-fortified).   cup (125 mL) flavored gelatin (plain or fortified). Document Released: 07/12/2005 Document Revised: 10/04/2011 Document Reviewed: 10/09/2010 [facility] Patient Information 2013 [place], [place].  Syncope Syncope is a fainting spell. This means the person loses consciousness and drops to the ground. The person is generally unconscious for less than 5 minutes.  The person may have some muscle twitches for up to 15 seconds before waking up and returning to normal. Syncope occurs more often in elderly people, but it can happen to anyone. While most causes of syncope are not dangerous, syncope can be a sign of a serious medical problem. It is important to seek medical care.  CAUSES  Syncope is caused by a sudden decrease in blood flow to the brain. The specific cause is often not determined. Factors that can trigger syncope include:  Taking medicines that lower blood pressure.  Sudden changes in posture, such as standing up suddenly.  Taking more medicine than prescribed.  Standing in one place for too long.  Seizure disorders.  Dehydration and excessive  exposure to heat.  Low blood sugar (hypoglycemia).  Straining to have a bowel movement.  Heart disease, irregular heartbeat, or other circulatory problems.  Fear, emotional distress, seeing blood, or severe pain. SYMPTOMS  Right before fainting, you may:  Feel dizzy or lightheaded.  Feel nauseous.  See all white or all black in your field of vision.  Have cold, clammy skin. DIAGNOSIS  Your caregiver will ask about your symptoms, perform a physical exam, and perform electrocardiography (ECG) to record the electrical activity of your heart. Your caregiver may also perform other heart or blood tests to determine the cause of your syncope. TREATMENT  In most cases, no treatment is needed. Depending on the cause of your syncope, your caregiver may recommend changing or stopping some of your medicines. HOME CARE INSTRUCTIONS  Have someone stay with you until you feel stable.  Do not drive, operate machinery, or play sports until your caregiver says it is okay.  Keep all follow-up appointments as directed by your caregiver.  Lie down right away if you start feeling like you might faint. Breathe deeply and steadily. Wait until all the symptoms have passed.  Drink enough fluids to keep your urine clear or pale yellow.  If you are taking blood pressure or heart medicine, get up slowly, taking several minutes to sit and then stand. This can reduce dizziness. SEEK IMMEDIATE MEDICAL CARE IF:   You have a severe headache.  You have unusual pain in the chest, abdomen, or back.  You are bleeding from the mouth or rectum, or you have black or tarry stool.  You have an irregular or very fast heartbeat.  You have pain with breathing.  You have repeated fainting or seizure-like jerking during an episode.  You faint when sitting or lying down.  You have confusion.  You have difficulty walking.  You have severe weakness.  You have vision problems. If you fainted, call your local  emergency services (911 in [place] Do not drive yourself to the hospital.  MAKE SURE YOU:  Understand these instructions.  Will watch your condition.  Will get help right away if you are not doing well or get worse. Document Released: 07/12/2005 Document Revised: 01/11/2012 Document Reviewed: 09/10/2011 [facility] Patient Information 2013 [place], [place].  Rehydration, Adult Rehydration is the replacement of body fluids lost during dehydration. Dehydration is an extreme loss of body fluids to the point of body function impairment. There are many ways extreme fluid loss can occur, including vomiting, diarrhea, or excess sweating. Recovering from dehydration requires replacing lost fluids, continuing to eat to maintain strength, and avoiding foods and beverages that may contribute to further fluid loss or may increase nausea.  HOW TO REHYDRATE In most cases, rehydration involves the replacement of not only fluids  but also carbohydrates and basic body salts. Rehydration with an oral rehydration solution is one way to replace essential nutrients lost through dehydration. An oral rehydration solution can be purchased at pharmacies, retail stores, and online. Premixed packets of powder that you combine with water to make a solution are also sold. You can prepare an oral rehydration solution at home by mixing the following ingredients together:   1 L (34 oz) of water.   1 level tsp of salt.   8 level tsp of sugar. Be sure to use exact measurements. Including too much sugar can make diarrhea worse. Drink  1 cup (120 240 mL) of oral rehydration solution each time you have diarrhea or vomit. If drinking this amount makes your vomiting worse, try drinking smaller amounts more often. For example, drink 1 3 tsp every 5 10 minutes.  A general rule for staying hydrated is to drink 1 2 L of fluid per day. Talk to your caregiver about the specific amount you should be drinking each day. Drink enough fluids  to keep your urine clear or pale yellow. EATING WHEN DEHYDRATED Even if you have had severe sweating or you are having diarrhea, do not stop eating. Many healthy items in a normal diet are okay to continue eating while recovering from dehydration. The following tips can help you to lessen nausea when you eat:  Ask someone else to prepare your food. Cooking smells may worsen nausea.  Eat in a well-ventilated room away from cooking smells.  Sit up when you eat. Avoid lying down until 1 2 hours after eating.  Eat small amounts when you eat.  Eat foods that are easy to digest. These include soft, well-cooked, or mashed foods. FOODS AND BEVERAGES TO AVOID Avoid eating or drinking the following foods and beverages that may increase nausea or further loss of fluid:   Fruit juices with a high sugar content, such as concentrated juices.  Alcohol.  Beverages containing caffeine.  Carbonated drinks. They may cause a lot of gas.  Foods that may cause a lot of gas, such as cabbage, broccoli, and beans.  Fatty, greasy, and fried foods.  Spicy, very salty, and very sweet foods or drinks.  Foods or drinks that are very hot or very cold. Consume food or drinks at or near room temperature.  Foods that need a lot of chewing, such as raw vegetables.  Foods that are sticky or hard to swallow, such as peanut butter. Document Released: 10/04/2011 Document Reviewed: 10/04/2011 [facility] Patient Information 2013 [place], [place].

## 2012-11-15 NOTE — ED Notes (Signed)
Pt giving presentation in class and passed out. Her teacher caught her and eased her to the ground.  Per teacher, pt out for 5 minutes.  CBG 85.  Irregular HR noted.  Pt ao x 4 presently.

## 2012-11-16 LAB — URINE CULTURE: Colony Count: 15000

## 2013-04-09 ENCOUNTER — Encounter (HOSPITAL_COMMUNITY): Payer: Self-pay | Admitting: *Deleted

## 2013-04-09 ENCOUNTER — Emergency Department (HOSPITAL_COMMUNITY)
Admission: EM | Admit: 2013-04-09 | Discharge: 2013-04-10 | Disposition: A | Discharge status: Home / Self Care | Payer: BC Managed Care – PPO | Attending: Emergency Medicine | Admitting: Emergency Medicine

## 2013-04-09 ENCOUNTER — Emergency Department (HOSPITAL_COMMUNITY): Payer: BC Managed Care – PPO

## 2013-04-09 DIAGNOSIS — S0990XA Unspecified injury of head, initial encounter: Secondary | ICD-10-CM | POA: Insufficient documentation

## 2013-04-09 DIAGNOSIS — Y9361 Activity, american tackle football: Secondary | ICD-10-CM | POA: Insufficient documentation

## 2013-04-09 DIAGNOSIS — W219XXA Striking against or struck by unspecified sports equipment, initial encounter: Secondary | ICD-10-CM | POA: Insufficient documentation

## 2013-04-09 DIAGNOSIS — S0121XA Laceration without foreign body of nose, initial encounter: Secondary | ICD-10-CM

## 2013-04-09 DIAGNOSIS — Z8781 Personal history of (healed) traumatic fracture: Secondary | ICD-10-CM | POA: Insufficient documentation

## 2013-04-09 DIAGNOSIS — R040 Epistaxis: Secondary | ICD-10-CM

## 2013-04-09 DIAGNOSIS — R42 Dizziness and giddiness: Secondary | ICD-10-CM | POA: Insufficient documentation

## 2013-04-09 DIAGNOSIS — S022XXB Fracture of nasal bones, initial encounter for open fracture: Secondary | ICD-10-CM | POA: Insufficient documentation

## 2013-04-09 DIAGNOSIS — F3289 Other specified depressive episodes: Secondary | ICD-10-CM | POA: Insufficient documentation

## 2013-04-09 DIAGNOSIS — S0083XA Contusion of other part of head, initial encounter: Secondary | ICD-10-CM

## 2013-04-09 DIAGNOSIS — F329 Major depressive disorder, single episode, unspecified: Secondary | ICD-10-CM | POA: Insufficient documentation

## 2013-04-09 DIAGNOSIS — S0003XA Contusion of scalp, initial encounter: Secondary | ICD-10-CM | POA: Insufficient documentation

## 2013-04-09 DIAGNOSIS — Z880 Allergy status to penicillin: Secondary | ICD-10-CM | POA: Insufficient documentation

## 2013-04-09 DIAGNOSIS — S0285XA Fracture of orbit, unspecified, initial encounter for closed fracture: Secondary | ICD-10-CM

## 2013-04-09 DIAGNOSIS — Y9239 Other specified sports and athletic area as the place of occurrence of the external cause: Secondary | ICD-10-CM | POA: Insufficient documentation

## 2013-04-09 DIAGNOSIS — S0280XA Fracture of other specified skull and facial bones, unspecified side, initial encounter for closed fracture: Secondary | ICD-10-CM | POA: Insufficient documentation

## 2013-04-09 NOTE — ED Notes (Signed)
Pt reports she was playing football this evening was incidentally hit in the face by another players shoulder, pt denies any LOC - states she is experiencing continued epistaxis and has a small 0.5cm lac to bridge of nose. Pt A&Ox4

## 2013-04-09 NOTE — ED Notes (Signed)
Pt resting quietly, updated on plan of care.  Family at bedside.

## 2013-04-10 MED ORDER — AZITHROMYCIN 250 MG PO TABS: 250.0000 mg | ORAL_TABLET | Freq: Every day | ORAL | Status: AC

## 2013-04-10 MED ORDER — IBUPROFEN 800 MG PO TABS
800.0000 mg | ORAL_TABLET | Freq: Once | ORAL | Status: AC
  Administered 2013-04-10: 800 mg via ORAL
  Filled 2013-04-10: qty 1

## 2013-04-10 MED ORDER — IBUPROFEN 800 MG PO TABS: 800.0000 mg | ORAL_TABLET | Freq: Three times a day (TID) | ORAL | Status: DC

## 2013-04-10 NOTE — ED Provider Notes (Signed)
CSN: [id number]     Arrival date & time 04/09/13  2217 History   First MD Initiated Contact with Patient 04/09/13 2309     Chief Complaint  Patient presents with  . Facial Injury   (Consider location/radiation/quality/duration/timing/severity/associated sxs/prior Treatment) Patient is a 22 y.o. female presenting with facial injury. The history is provided by the patient.  Facial Injury Associated symptoms: epistaxis and headaches   Associated symptoms: no congestion, no ear pain, no nausea, no neck pain and no vomiting    patient sustained an arm to her left cheek and nose sometime around 7 PM while playing football. Patient's primary care doctor [facility]. No loss of consciousness no nausea and vomiting however patient does have some lightheadedness. Patient sustained some bleeding from the bridge of her nose and internally from her nose which has now stopped. Patient's tetanus is up-to-date. Patient states the pain is about a 4/10. She has swelling and tenderness to her left cheek and to the bridge of her nose. States that he feels fine and that her bite feels normal. No neck pain no chest pain no trouble breathing no back pain no bowel pain and no pain to her extremities.  Past Medical History  Diagnosis Date  . Headache(784.0)     allergy-related  . Depression   . Fracture/dislocation, finger, proximal/middle phalanx 07/2011    left small finger   Past Surgical History  Procedure Laterality Date  . Wisdom tooth extraction    . Orif finger fracture  08/13/2011    Procedure: OPEN REDUCTION INTERNAL FIXATION (ORIF) METACARPAL (FINGER) FRACTURE;  Surgeon: Wyn Forster., MD;  Location: [facility];  Service: Orthopedics;  Laterality: Left;  ORIF of Articular fracture   History reviewed. No pertinent family history. History  Substance Use Topics  . Smoking status: Never Smoker   . Smokeless tobacco: Never Used  . Alcohol Use: No   OB History    Grav Para Term Preterm Abortions TAB SAB Ect Mult Living                 Review of Systems  Constitutional: Negative for fever.  HENT: Positive for nosebleeds and facial swelling. Negative for ear pain, congestion, drooling, trouble swallowing, neck pain, dental problem, voice change and tinnitus.   Eyes: Positive for redness. Negative for visual disturbance.  Respiratory: Negative for shortness of breath.   Cardiovascular: Negative for chest pain.  Gastrointestinal: Negative for nausea, vomiting and abdominal pain.  Musculoskeletal: Negative for back pain.  Skin: Positive for wound.  Neurological: Positive for light-headedness and headaches. Negative for weakness.  Hematological: Does not bruise/bleed easily.  Psychiatric/Behavioral: Negative for confusion.    Allergies  Amoxicillin; Penicillins; and Tylenol  Home Medications   Current Outpatient Rx  Name  Route  Sig  Dispense  Refill  . aspirin-acetaminophen-caffeine (EXCEDRIN MIGRAINE) 250-250-65 MG per tablet   Oral   Take 1 tablet by mouth every 6 (six) hours as needed for pain.          . cetirizine (ZYRTEC) 10 MG tablet   Oral   Take 10 mg by mouth as needed for allergies.         Marland Kitchen ibuprofen (ADVIL,MOTRIN) 200 MG tablet   Oral   Take 400 mg by mouth every 6 (six) hours as needed for pain.         . medroxyPROGESTERone (DEPO-PROVERA) 150 MG/ML injection   Intramuscular   Inject 150 mg into the muscle  every 3 (three) months.         . traMADol (ULTRAM) 50 MG tablet   Oral   Take 50 mg by mouth every 6 (six) hours as needed for pain.         Marland Kitchen azithromycin (ZITHROMAX) 250 MG tablet   Oral   Take 1 tablet (250 mg total) by mouth daily. Take first 2 tablets together, then 1 every day until finished.   6 tablet   0   . ibuprofen (ADVIL,MOTRIN) 800 MG tablet   Oral   Take 1 tablet (800 mg total) by mouth 3 (three) times daily.   21 tablet   0    BP 118/82  Pulse 66  Temp(Src) 98.4 F (36.9 C)  (Oral)  Resp 16  SpO2 100% Physical Exam  Nursing note and vitals reviewed. Constitutional: She is oriented to person, place, and time. She appears well-developed and well-nourished.  HENT:  Head: Normocephalic.  Right Ear: External ear normal.  Left Ear: External ear normal.  Mouth/Throat: Oropharynx is clear and moist.  Swelling to the bridge of the nose no obvious deformity. Superficial 4 mm abrasion laceration to the midportion bridge of nose. Left and right nares without any bleeding. Swelling to the left cheek and early contusion forming. Hematomata skull area the lateral aspect of the left thigh no hyphema. Oral pharynx no blood draining down the back of the throat.  Eyes: Conjunctivae and EOM are normal.  See above left sclera with lateral hematoma no hyphema. Full range of motion of the eyes.  Neck: Normal range of motion. Neck supple. No tracheal deviation present.  Cardiovascular: Normal rate, regular rhythm, normal heart sounds and intact distal pulses.   No murmur heard. Pulmonary/Chest: Effort normal and breath sounds normal. No stridor. No respiratory distress.  Abdominal: Soft. Bowel sounds are normal. There is no tenderness.  Musculoskeletal: Normal range of motion. She exhibits no edema and no tenderness.  Neurological: She is alert and oriented to person, place, and time. No cranial nerve deficit. She exhibits normal muscle tone. Coordination normal.  Skin: Skin is warm.    ED Course  Procedures (including critical care time) Labs Review Labs Reviewed - No data to display Imaging Review Ct Head Wo Contrast  04/10/2013   *RADIOLOGY REPORT*  Clinical Data:  Sports injury, facial trauma with epistaxis.  CT HEAD WITHOUT CONTRAST CT MAXILLOFACIAL WITHOUT CONTRAST  Technique:  Multidetector CT imaging of the head and maxillofacial structures were performed using the standard protocol without intravenous contrast. Multiplanar CT image reconstructions of the maxillofacial  structures were also generated.  Comparison:  None available at this institution and time of the interpretation.  CT HEAD  Findings: The ventricles and sulci are normal.  No interparenchymal hemorrhage, midline shift, mass effect or acute large vascular territory infarcts.  No abnormal extra-axial fluid collections, basal cisterns are patent.  No depressed calvarial fracture.  IMPRESSION: No acute intracranial process.  CT MAXILLOFACIAL  Findings:   Acute left medial orbital blowout fracture, medial deviation of the lamina papyrcea into fractured left ethmoid air cells.  Enlarged left medial rectus muscle most consistent with hematoma, with external herniation post intraconal fat, extraocular muscles are located. Ocular globes are well formed is located. Preservation of the retrobulbar fat.  Highly comminuted left nasal bone fracture with mild depression. Mild offset of the bony nasal spine may reflect an additional fracture.  Left nasal ala soft tissue swelling with bubbles of gas most consistent with laceration, no radiopaque  foreign bodies.  Mild frothy secretions and left ethmoid air cells without air fluid level.  No destructive bony lesions.  IMPRESSION: Acute left medial orbital blowout fracture with left medial rectus hematoma, extraocular muscles are located.  Comminuted mildly depressed left nasal bone fracture.   Original Report Authenticated By: Awilda Metro   Ct Maxillofacial Wo Cm  04/10/2013   *RADIOLOGY REPORT*  Clinical Data:  Sports injury, facial trauma with epistaxis.  CT HEAD WITHOUT CONTRAST CT MAXILLOFACIAL WITHOUT CONTRAST  Technique:  Multidetector CT imaging of the head and maxillofacial structures were performed using the standard protocol without intravenous contrast. Multiplanar CT image reconstructions of the maxillofacial structures were also generated.  Comparison:  None available at this institution and time of the interpretation.  CT HEAD  Findings: The ventricles and sulci  are normal.  No interparenchymal hemorrhage, midline shift, mass effect or acute large vascular territory infarcts.  No abnormal extra-axial fluid collections, basal cisterns are patent.  No depressed calvarial fracture.  IMPRESSION: No acute intracranial process.  CT MAXILLOFACIAL  Findings:   Acute left medial orbital blowout fracture, medial deviation of the lamina papyrcea into fractured left ethmoid air cells.  Enlarged left medial rectus muscle most consistent with hematoma, with external herniation post intraconal fat, extraocular muscles are located. Ocular globes are well formed is located. Preservation of the retrobulbar fat.  Highly comminuted left nasal bone fracture with mild depression. Mild offset of the bony nasal spine may reflect an additional fracture.  Left nasal ala soft tissue swelling with bubbles of gas most consistent with laceration, no radiopaque foreign bodies.  Mild frothy secretions and left ethmoid air cells without air fluid level.  No destructive bony lesions.  IMPRESSION: Acute left medial orbital blowout fracture with left medial rectus hematoma, extraocular muscles are located.  Comminuted mildly depressed left nasal bone fracture.   Original Report Authenticated By: Awilda Metro    MDM   1. Nosebleed   2. Facial contusion, initial encounter   3. Laceration of nose, initial encounter   4. Orbital fracture, closed, initial encounter   5. Nasal bone fracture, open, initial encounter    Fractures as noted above. Will treat with an antibiotic to prevent sinus infection. Will need to followup the with maxillofacial trauma ear nose and throat. Referral provided. No intracranial pathology or skull fractures. Nasal superficial laceration probably represents a bit of an open fracture we'll treat with Polysporin Neosporin back straight ointment and a Band-Aid.    Shelda Jakes, MD 04/10/13 [phone]

## 2013-05-03 ENCOUNTER — Other Ambulatory Visit: Payer: Self-pay | Admitting: Family Medicine

## 2013-05-03 ENCOUNTER — Other Ambulatory Visit (HOSPITAL_COMMUNITY)
Admission: RE | Admit: 2013-05-03 | Discharge: 2013-05-03 | Disposition: A | Discharge status: Home / Self Care | Payer: BC Managed Care – PPO | Source: Ambulatory Visit | Attending: Family Medicine | Admitting: Family Medicine

## 2013-05-03 DIAGNOSIS — Z01419 Encounter for gynecological examination (general) (routine) without abnormal findings: Secondary | ICD-10-CM | POA: Insufficient documentation

## 2014-03-06 ENCOUNTER — Emergency Department (HOSPITAL_COMMUNITY)
Admission: EM | Admit: 2014-03-06 | Discharge: 2014-03-06 | Disposition: A | Discharge status: Home / Self Care | Payer: BC Managed Care – PPO | Source: Home / Self Care | Attending: Family Medicine | Admitting: Family Medicine

## 2014-03-06 ENCOUNTER — Encounter (HOSPITAL_COMMUNITY): Payer: Self-pay | Admitting: Emergency Medicine

## 2014-03-06 DIAGNOSIS — A5400 Gonococcal infection of lower genitourinary tract, unspecified: Secondary | ICD-10-CM

## 2014-03-06 DIAGNOSIS — A549 Gonococcal infection, unspecified: Secondary | ICD-10-CM

## 2014-03-06 LAB — POCT URINALYSIS DIP (DEVICE)
Bilirubin Urine: NEGATIVE
Glucose, UA: NEGATIVE mg/dL
Hgb urine dipstick: NEGATIVE
Ketones, ur: NEGATIVE mg/dL
Leukocytes, UA: NEGATIVE
Nitrite: NEGATIVE
Protein, ur: 30 mg/dL — AB
Specific Gravity, Urine: 1.025 (ref 1.005–1.030)
Urobilinogen, UA: 0.2 mg/dL (ref 0.0–1.0)
pH: 6 (ref 5.0–8.0)

## 2014-03-06 LAB — POCT PREGNANCY, URINE: Preg Test, Ur: NEGATIVE

## 2014-03-06 MED ORDER — ONDANSETRON 4 MG PO TBDP
8.0000 mg | ORAL_TABLET | Freq: Once | ORAL | Status: AC
  Administered 2014-03-06: 8 mg via ORAL

## 2014-03-06 MED ORDER — ONDANSETRON 4 MG PO TBDP
ORAL_TABLET | ORAL | Status: AC
  Filled 2014-03-06: qty 2

## 2014-03-06 MED ORDER — AZITHROMYCIN 250 MG PO TABS
2000.0000 mg | ORAL_TABLET | Freq: Once | ORAL | Status: AC
  Administered 2014-03-06: 2000 mg via ORAL

## 2014-03-06 MED ORDER — AZITHROMYCIN 250 MG PO TABS
ORAL_TABLET | ORAL | Status: AC
  Filled 2014-03-06: qty 8

## 2014-03-06 NOTE — Discharge Instructions (Signed)
Thank you for coming in today. Follow up with your doctor or back here in about 2 weeks for recheck.  Come back as needed.   Gonorrhea Gonorrhea is an infection that can cause serious problems. If left untreated, the infection may:   Damage the female or female organs.   Cause women to be unable to have children (sterility).   Harm a fetus if the infected woman is pregnant.  It is important to get treatment for gonorrhea as soon as possible. It is also necessary that all your sexual partners be tested for the infection.  CAUSES  Gonorrhea is caused by bacteria called Neisseria gonorrhoeae. The infection is spread from person to person, usually by sexual contact (such as by anal, vaginal, or oral means). A newborn can contract the infection from his or her mother during birth.  SYMPTOMS  Some people with gonorrhea do not have symptoms. Symptoms may be different in females and males.  Females The most common symptoms are:   Pain in the lower abdomen.   Fever with or without chills.  Other symptoms include:   Abnormal vaginal discharge.   Painful intercourse.   Burning or itching of the vagina or lips of the vagina.   Abnormal vaginal bleeding.   Pain when urinating.   Long-lasting (chronic) pain in the lower abdomen, especially during menstruation or intercourse.   Inability to become pregnant.   Going into premature labor.   Irritation, pain, bleeding, or discharge from the rectum. This may occur if the infection was spread by anal sex.   Sore throat or swollen lymph nodes in the neck. This may occur if the infection was spread by oral sex.  Males The most common symptoms are:   Discharge from the penis.   Pain or burning during urination.   Pain or swelling in the testicles. Other symptoms may include:   Irritation, pain, bleeding, or discharge from the rectum. This may occur if the infection was spread by anal sex.   Sore throat, fever, or  swollen lymph nodes in the neck. This may occur if the infection was spread by oral sex.  DIAGNOSIS  A diagnosis is made after a physical exam is done and a sample of discharge is examined under a microscope for the presence of the bacteria. The discharge may be taken from the urethra, cervix, throat, or rectum.  TREATMENT  Gonorrhea is treated with antibiotic medicines. It is important for treatment to begin as soon as possible. Early treatment may prevent some problems from developing.  HOME CARE INSTRUCTIONS   Take medicines only as directed by your health care provider.   Take your antibiotic medicine as directed by your health care provider. Finish the antibiotic even if you start to feel better. Incomplete treatment will put you at risk for continued infection.   Do not have sex until treatment is complete or as directed by your health care provider.   Keep all follow-up visits as directed by your health care provider.   Not all test results are available during your visit. If your test results are not back during the visit, make an appointment with your health care provider to find out the results. Do not assume everything is normal if you have not heard from your health care provider or the medical facility. It is your responsibility to get your test results.  If you test positive for gonorrhea, inform your recent sexual partners. They need to be checked for gonorrhea even if they  do not have symptoms. They may need treatment, even if they test negative for gonorrhea.  SEEK MEDICAL CARE IF:   You develop any bad reaction to the medicine you were prescribed. This may include:   A rash.   Nausea.   Vomiting.   Diarrhea.   Your symptoms do not improve after a few days of taking antibiotics.   Your symptoms get worse.   You develop increased pain, such as in the testicles (for males) or in the abdomen (for females).  You have a fever. MAKE SURE YOU:    Understand these instructions.  Will watch your condition.  Will get help right away if you are not doing well or get worse. Document Released: 07/09/2000 Document Revised: 11/26/2013 Document Reviewed: 01/17/2013 [facility] Patient Information 2015 [place], [place]. This information is not intended to replace advice given to you by your health care provider. Make sure you discuss any questions you have with your health care provider.
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
## 2014-03-06 NOTE — ED Provider Notes (Signed)
Jennifer Patton is a 23 y.o. female who presents to Urgent Care today for gonorrhea. Patient was diagnosed with gonorrhea last week with a routine health checkup. She is here today for treatment. She is asymptomatic and feels well. No fevers or chills nausea vomiting or diarrhea.  She notes a severe allergy to amoxicillin and penicillin involving swelling and difficulty breathing as a child.   Past Medical History  Diagnosis Date  . Headache(784.0)     allergy-related  . Depression   . Fracture/dislocation, finger, proximal/middle phalanx 07/2011    left small finger   History  Substance Use Topics  . Smoking status: Never Smoker   . Smokeless tobacco: Never Used  . Alcohol Use: No   ROS as above Medications: No current facility-administered medications for this encounter.   Current Outpatient Prescriptions  Medication Sig Dispense Refill  . aspirin-acetaminophen-caffeine (EXCEDRIN MIGRAINE) 250-250-65 MG per tablet Take 1 tablet by mouth every 6 (six) hours as needed for pain.       Marland Kitchen azithromycin (ZITHROMAX) 250 MG tablet Take 1 tablet (250 mg total) by mouth daily. Take first 2 tablets together, then 1 every day until finished.  6 tablet  0  . cetirizine (ZYRTEC) 10 MG tablet Take 10 mg by mouth as needed for allergies.      Marland Kitchen ibuprofen (ADVIL,MOTRIN) 200 MG tablet Take 400 mg by mouth every 6 (six) hours as needed for pain.      Marland Kitchen ibuprofen (ADVIL,MOTRIN) 800 MG tablet Take 1 tablet (800 mg total) by mouth 3 (three) times daily.  21 tablet  0  . medroxyPROGESTERone (DEPO-PROVERA) 150 MG/ML injection Inject 150 mg into the muscle every 3 (three) months.      . traMADol (ULTRAM) 50 MG tablet Take 50 mg by mouth every 6 (six) hours as needed for pain.        Exam:  BP 122/70  Pulse 72  Temp(Src) 98.6 F (37 C) (Oral)  Resp 18  SpO2 100% Gen: Well NAD GYN: Normal external genitalia without discharge. No lesions identified.  Results for orders placed during the hospital  encounter of 03/06/14 (from the past 24 hour(s))  POCT URINALYSIS DIP (DEVICE)     Status: Abnormal   Collection Time    03/06/14  9:16 AM      Result Value Ref Range   Glucose, UA NEGATIVE  NEGATIVE mg/dL   Bilirubin Urine NEGATIVE  NEGATIVE   Ketones, ur NEGATIVE  NEGATIVE mg/dL   Specific Gravity, Urine 1.025  1.005 - 1.030   Hgb urine dipstick NEGATIVE  NEGATIVE   pH 6.0  5.0 - 8.0   Protein, ur 30 (*) NEGATIVE mg/dL   Urobilinogen, UA 0.2  0.0 - 1.0 mg/dL   Nitrite NEGATIVE  NEGATIVE   Leukocytes, UA NEGATIVE  NEGATIVE  POCT PREGNANCY, URINE     Status: None   Collection Time    03/06/14  9:18 AM      Result Value Ref Range   Preg Test, Ur NEGATIVE  NEGATIVE   No results found.  Assessment and Plan: 23 y.o. female with gonorrhea. Patient is significantly penicillin allergic. Will attempt to treat with 2 g of azithromycin oral and test of cure in 2 weeks. Return to this clinic or primary care office.  Discussed warning signs or symptoms. Please see discharge instructions. Patient expresses understanding.   This note was created using Systems analyst. Any transcription errors are unintended.    Gregor Hams, MD  03/06/14 1029 

## 2014-03-22 ENCOUNTER — Telehealth (HOSPITAL_COMMUNITY): Payer: Self-pay | Admitting: *Deleted

## 2014-03-22 NOTE — ED Notes (Signed)
I called pt. and told her the [facility] Dept. called me and said she needs further treatment per the CDC guidelines.  It told her that Dr. Georgina Snell wants her to come back and get retested for a cure and if still pos. We would add the new medication.  She said she was already [facility]. I asked her do they treat her if is still pos. She said they would send her to the [facility] to be treated. I told her I would notify [name] at the [facility] and Dr. Georgina Snell of this.  I called [name]. She said she can f/u the test result and treat if needed.  Dr. Georgina Snell notified by staff message. Jennifer Patton 03/22/2014

## 2016-01-01 DIAGNOSIS — Z3042 Encounter for surveillance of injectable contraceptive: Secondary | ICD-10-CM | POA: Diagnosis not present

## 2016-02-12 DIAGNOSIS — B009 Herpesviral infection, unspecified: Secondary | ICD-10-CM | POA: Diagnosis not present

## 2016-02-12 DIAGNOSIS — G441 Vascular headache, not elsewhere classified: Secondary | ICD-10-CM | POA: Diagnosis not present

## 2016-03-19 DIAGNOSIS — Z3042 Encounter for surveillance of injectable contraceptive: Secondary | ICD-10-CM | POA: Diagnosis not present

## 2016-03-19 DIAGNOSIS — L989 Disorder of the skin and subcutaneous tissue, unspecified: Secondary | ICD-10-CM | POA: Diagnosis not present

## 2016-06-02 DIAGNOSIS — A749 Chlamydial infection, unspecified: Secondary | ICD-10-CM | POA: Diagnosis not present

## 2016-06-03 DIAGNOSIS — Z3042 Encounter for surveillance of injectable contraceptive: Secondary | ICD-10-CM | POA: Diagnosis not present

## 2016-08-19 DIAGNOSIS — Z23 Encounter for immunization: Secondary | ICD-10-CM | POA: Diagnosis not present

## 2016-08-19 DIAGNOSIS — Z3042 Encounter for surveillance of injectable contraceptive: Secondary | ICD-10-CM | POA: Diagnosis not present

## 2016-08-20 DIAGNOSIS — Z111 Encounter for screening for respiratory tuberculosis: Secondary | ICD-10-CM | POA: Diagnosis not present

## 2016-08-23 DIAGNOSIS — Z113 Encounter for screening for infections with a predominantly sexual mode of transmission: Secondary | ICD-10-CM | POA: Diagnosis not present

## 2016-08-23 DIAGNOSIS — A749 Chlamydial infection, unspecified: Secondary | ICD-10-CM | POA: Diagnosis not present

## 2016-08-23 DIAGNOSIS — M549 Dorsalgia, unspecified: Secondary | ICD-10-CM | POA: Diagnosis not present

## 2016-10-29 DIAGNOSIS — F419 Anxiety disorder, unspecified: Secondary | ICD-10-CM | POA: Diagnosis not present

## 2016-11-09 DIAGNOSIS — Z3042 Encounter for surveillance of injectable contraceptive: Secondary | ICD-10-CM | POA: Diagnosis not present

## 2016-12-06 DIAGNOSIS — G4452 New daily persistent headache (NDPH): Secondary | ICD-10-CM | POA: Diagnosis not present

## 2016-12-06 DIAGNOSIS — F419 Anxiety disorder, unspecified: Secondary | ICD-10-CM | POA: Diagnosis not present

## 2016-12-10 ENCOUNTER — Other Ambulatory Visit: Payer: Self-pay | Admitting: Family Medicine

## 2016-12-10 DIAGNOSIS — G4452 New daily persistent headache (NDPH): Secondary | ICD-10-CM

## 2017-01-25 DIAGNOSIS — Z3042 Encounter for surveillance of injectable contraceptive: Secondary | ICD-10-CM | POA: Diagnosis not present

## 2017-01-25 DIAGNOSIS — Z309 Encounter for contraceptive management, unspecified: Secondary | ICD-10-CM | POA: Diagnosis not present

## 2017-04-12 DIAGNOSIS — Z3042 Encounter for surveillance of injectable contraceptive: Secondary | ICD-10-CM | POA: Diagnosis not present

## 2017-05-19 DIAGNOSIS — H16293 Other keratoconjunctivitis, bilateral: Secondary | ICD-10-CM | POA: Diagnosis not present

## 2017-05-26 DIAGNOSIS — H16293 Other keratoconjunctivitis, bilateral: Secondary | ICD-10-CM | POA: Diagnosis not present

## 2017-07-01 DIAGNOSIS — Z3042 Encounter for surveillance of injectable contraceptive: Secondary | ICD-10-CM | POA: Diagnosis not present

## 2017-07-14 ENCOUNTER — Encounter (HOSPITAL_COMMUNITY): Payer: Self-pay | Admitting: Emergency Medicine

## 2017-07-14 ENCOUNTER — Other Ambulatory Visit: Payer: Self-pay

## 2017-07-14 ENCOUNTER — Emergency Department (HOSPITAL_COMMUNITY)
Admission: EM | Admit: 2017-07-14 | Discharge: 2017-07-14 | Disposition: A | Discharge status: Home / Self Care | Payer: BC Managed Care – PPO | Attending: Emergency Medicine | Admitting: Emergency Medicine

## 2017-07-14 DIAGNOSIS — Z79899 Other long term (current) drug therapy: Secondary | ICD-10-CM | POA: Insufficient documentation

## 2017-07-14 DIAGNOSIS — R51 Headache: Secondary | ICD-10-CM | POA: Insufficient documentation

## 2017-07-14 DIAGNOSIS — R519 Headache, unspecified: Secondary | ICD-10-CM

## 2017-07-14 NOTE — ED Provider Notes (Addendum)
[facility] DEPT Provider Note   CSN: [id number] Arrival date & time: 07/14/17  8185     History   Chief Complaint Chief Complaint  Patient presents with  . Headache    HPI Jennifer Patton is a 26 y.o. female.  Complains of diffuse headache onset last night typical of headaches she is gotten daily for 10 years accompanied by nausea.  She vomited one time today.  She treated herself with tramadol at midnight yesterday and at 5:30 AM today.  Also treat herself with Excedrin.  Presently headache is mild.  No focal numbness or weakness.  She was mildly lightheaded when she had a headache.  No other associated symptoms.  No nausea present.  No one else at home ill.  She had similar headache a few months ago during which time she is vomited.  But she does get daily headaches.  HPI  Past Medical History:  Diagnosis Date  . Depression   . Fracture/dislocation, finger, proximal/middle phalanx 07/2011   left small finger  . Headache(784.0)    allergy-related    There are no active problems to display for this patient.   Past Surgical History:  Procedure Laterality Date  . ORIF FINGER FRACTURE  08/13/2011   Procedure: OPEN REDUCTION INTERNAL FIXATION (ORIF) METACARPAL (FINGER) FRACTURE;  Surgeon: Cammie Sickle., MD;  Location: [facility];  Service: Orthopedics;  Laterality: Left;  ORIF of Articular fracture  . WISDOM TOOTH EXTRACTION      OB History    No data available       Home Medications    Prior to Admission medications   Medication Sig Start Date End Date Taking? Authorizing Provider  aspirin-acetaminophen-caffeine (EXCEDRIN MIGRAINE) [phone] MG per tablet Take 1 tablet by mouth every 6 (six) hours as needed for pain.     [provider]  azithromycin (ZITHROMAX) 250 MG tablet Take 1 tablet (250 mg total) by mouth daily. Take first 2 tablets together, then 1 every day until finished. 04/10/13   Fredia Sorrow, MD  cetirizine (ZYRTEC) 10 MG tablet Take 10 mg by mouth as needed for allergies.    [provider]  ibuprofen (ADVIL,MOTRIN) 200 MG tablet Take 400 mg by mouth every 6 (six) hours as needed for pain.    [provider]  ibuprofen (ADVIL,MOTRIN) 800 MG tablet Take 1 tablet (800 mg total) by mouth 3 (three) times daily. 04/10/13   Fredia Sorrow, MD  medroxyPROGESTERone (DEPO-PROVERA) 150 MG/ML injection Inject 150 mg into the muscle every 3 (three) months.    [provider]  traMADol (ULTRAM) 50 MG tablet Take 50 mg by mouth every 6 (six) hours as needed for pain.    [provider]    Family History No family history on file.  Social History Social History   Tobacco Use  . Smoking status: Never Smoker  . Smokeless tobacco: Never Used  Substance Use Topics  . Alcohol use: No  . Drug use: No   Positive smoker  Allergies   Amoxicillin; Penicillins; and Tylenol [acetaminophen]   Review of Systems Review of Systems  Constitutional: Negative.   HENT: Negative.   Respiratory: Negative.   Cardiovascular: Negative.   Gastrointestinal: Positive for nausea and vomiting.  Genitourinary:       Amenorrheic  Musculoskeletal: Negative.   Skin: Negative.   Neurological: Positive for dizziness and headaches.  Psychiatric/Behavioral: Negative.   All other systems reviewed and are negative.  Physical Exam Updated Vital Signs BP 112/73 (BP Location: Left Arm)   Pulse 70   Temp 98.4 F (36.9 C) (Oral)   Resp 16   SpO2 98%   Physical Exam  Constitutional: She is oriented to person, place, and time. She appears well-developed and well-nourished.  HENT:  Head: Normocephalic and atraumatic.  Eyes: Conjunctivae are normal. Pupils are equal, round, and reactive to light.  Neck: Neck supple. No tracheal deviation present. No thyromegaly present.  Cardiovascular: Normal rate and regular rhythm.  No murmur heard. Pulmonary/Chest: Effort  normal and breath sounds normal.  Abdominal: Soft. Bowel sounds are normal. She exhibits no distension. There is no tenderness.  Musculoskeletal: Normal range of motion. She exhibits no edema or tenderness.  Neurological: She is alert and oriented to person, place, and time. Coordination normal.  Gait normal Romberg normal pronator drift normal finger to nose normal DTR symmetric bilaterally at knee jerk ankle jerk and biceps toes downward going bilaterally  Skin: Skin is warm and dry. No rash noted.  Psychiatric: She has a normal mood and affect.  Nursing note and vitals reviewed.    ED Treatments / Results  Labs (all labs ordered are listed, but only abnormal results are displayed) Labs Reviewed - No data to display  EKG  EKG Interpretation None       Radiology No results found.  Procedures Procedures (including critical care time)  Medications Ordered in ED Medications - No data to display   Initial Impression / Assessment and Plan / ED Course  I have reviewed the triage vital signs and the nursing notes.  Pertinent labs & imaging results that were available during my care of the patient were reviewed by me and considered in my medical decision making (see chart for details).     Patient has outpatient MRI of the brain scheduled for August 17, 2016 at 6:50 AM arranged by her primary care physician.  No emergent imaging needed and she suffers from chronic headaches  Final Clinical Impressions(s) / ED Diagnoses  Diagnosis  Bad headache Final diagnoses:  None    ED Discharge Orders    None       Orlie Dakin, MD 07/14/17 1131    Orlie Dakin, MD 07/14/17 1134

## 2017-07-14 NOTE — ED Triage Notes (Signed)
Pt complaint of headache onset last night; took tramadol as prescribed without relief. Pt verbalizes episode of vomiting today.

## 2017-07-14 NOTE — Discharge Instructions (Signed)
Keep your scheduled appointment at [facility] on 07/17/2017 at 6:50 AM to get your MRI

## 2017-07-17 ENCOUNTER — Ambulatory Visit
Admission: RE | Admit: 2017-07-17 | Discharge: 2017-07-17 | Disposition: A | Discharge status: Home / Self Care | Payer: Self-pay | Source: Ambulatory Visit | Attending: Family Medicine | Admitting: Family Medicine

## 2017-07-17 DIAGNOSIS — G4452 New daily persistent headache (NDPH): Secondary | ICD-10-CM

## 2017-07-17 DIAGNOSIS — R51 Headache: Secondary | ICD-10-CM | POA: Diagnosis not present

## 2017-07-21 ENCOUNTER — Other Ambulatory Visit: Payer: Self-pay | Admitting: Family Medicine

## 2017-07-21 DIAGNOSIS — G43009 Migraine without aura, not intractable, without status migrainosus: Secondary | ICD-10-CM | POA: Diagnosis not present

## 2017-07-21 DIAGNOSIS — J0100 Acute maxillary sinusitis, unspecified: Secondary | ICD-10-CM | POA: Diagnosis not present

## 2017-07-21 DIAGNOSIS — G4452 New daily persistent headache (NDPH): Secondary | ICD-10-CM

## 2017-07-21 DIAGNOSIS — Z6832 Body mass index (BMI) 32.0-32.9, adult: Secondary | ICD-10-CM | POA: Diagnosis not present

## 2017-07-21 DIAGNOSIS — E6609 Other obesity due to excess calories: Secondary | ICD-10-CM | POA: Diagnosis not present

## 2017-07-31 ENCOUNTER — Ambulatory Visit
Admission: RE | Admit: 2017-07-31 | Discharge: 2017-07-31 | Disposition: A | Discharge status: Home / Self Care | Payer: Self-pay | Source: Ambulatory Visit | Attending: Family Medicine | Admitting: Family Medicine

## 2017-07-31 DIAGNOSIS — R51 Headache: Secondary | ICD-10-CM | POA: Diagnosis not present

## 2017-07-31 DIAGNOSIS — G4452 New daily persistent headache (NDPH): Secondary | ICD-10-CM

## 2017-07-31 MED ORDER — GADOBENATE DIMEGLUMINE 529 MG/ML IV SOLN
15.0000 mL | Freq: Once | INTRAVENOUS | Status: AC | PRN
Start: 2017-07-31 — End: 2017-07-31
  Administered 2017-07-31: 15 mL via INTRAVENOUS

## 2017-09-19 DIAGNOSIS — Z3042 Encounter for surveillance of injectable contraceptive: Secondary | ICD-10-CM | POA: Diagnosis not present

## 2017-09-26 DIAGNOSIS — R6889 Other general symptoms and signs: Secondary | ICD-10-CM | POA: Diagnosis not present

## 2017-09-26 DIAGNOSIS — R3989 Other symptoms and signs involving the genitourinary system: Secondary | ICD-10-CM | POA: Diagnosis not present

## 2017-09-26 DIAGNOSIS — K602 Anal fissure, unspecified: Secondary | ICD-10-CM | POA: Diagnosis not present

## 2017-10-11 ENCOUNTER — Other Ambulatory Visit (HOSPITAL_COMMUNITY)
Admission: RE | Admit: 2017-10-11 | Discharge: 2017-10-11 | Disposition: A | Discharge status: Home / Self Care | Payer: BLUE CROSS/BLUE SHIELD | Source: Ambulatory Visit | Attending: Family Medicine | Admitting: Family Medicine

## 2017-10-11 ENCOUNTER — Other Ambulatory Visit: Payer: Self-pay | Admitting: Family Medicine

## 2017-10-11 DIAGNOSIS — Z136 Encounter for screening for cardiovascular disorders: Secondary | ICD-10-CM | POA: Diagnosis not present

## 2017-10-11 DIAGNOSIS — Z0000 Encounter for general adult medical examination without abnormal findings: Secondary | ICD-10-CM | POA: Insufficient documentation

## 2017-10-11 DIAGNOSIS — R61 Generalized hyperhidrosis: Secondary | ICD-10-CM | POA: Diagnosis not present

## 2017-10-11 DIAGNOSIS — Z209 Contact with and (suspected) exposure to unspecified communicable disease: Secondary | ICD-10-CM | POA: Diagnosis not present

## 2017-10-13 LAB — CYTOLOGY - PAP
Chlamydia: NEGATIVE
Diagnosis: NEGATIVE
Neisseria Gonorrhea: NEGATIVE

## 2017-12-05 DIAGNOSIS — Z3042 Encounter for surveillance of injectable contraceptive: Secondary | ICD-10-CM | POA: Diagnosis not present

## 2018-02-20 DIAGNOSIS — Z3042 Encounter for surveillance of injectable contraceptive: Secondary | ICD-10-CM | POA: Diagnosis not present

## 2018-02-24 DIAGNOSIS — Z202 Contact with and (suspected) exposure to infections with a predominantly sexual mode of transmission: Secondary | ICD-10-CM | POA: Diagnosis not present

## 2018-02-24 DIAGNOSIS — E6609 Other obesity due to excess calories: Secondary | ICD-10-CM | POA: Diagnosis not present

## 2018-02-24 DIAGNOSIS — N926 Irregular menstruation, unspecified: Secondary | ICD-10-CM | POA: Diagnosis not present

## 2018-02-24 DIAGNOSIS — N946 Dysmenorrhea, unspecified: Secondary | ICD-10-CM | POA: Diagnosis not present

## 2018-03-10 DIAGNOSIS — S39012A Strain of muscle, fascia and tendon of lower back, initial encounter: Secondary | ICD-10-CM | POA: Diagnosis not present

## 2018-05-01 DIAGNOSIS — K529 Noninfective gastroenteritis and colitis, unspecified: Secondary | ICD-10-CM | POA: Diagnosis not present

## 2018-05-08 DIAGNOSIS — Z3042 Encounter for surveillance of injectable contraceptive: Secondary | ICD-10-CM | POA: Diagnosis not present

## 2018-07-24 DIAGNOSIS — Z3042 Encounter for surveillance of injectable contraceptive: Secondary | ICD-10-CM | POA: Diagnosis not present

## 2018-10-13 DIAGNOSIS — Z3042 Encounter for surveillance of injectable contraceptive: Secondary | ICD-10-CM | POA: Diagnosis not present

## 2018-10-19 DIAGNOSIS — J069 Acute upper respiratory infection, unspecified: Secondary | ICD-10-CM | POA: Diagnosis not present

## 2018-11-10 DIAGNOSIS — F419 Anxiety disorder, unspecified: Secondary | ICD-10-CM | POA: Diagnosis not present

## 2018-12-29 ENCOUNTER — Encounter (HOSPITAL_COMMUNITY): Payer: Self-pay | Admitting: Emergency Medicine

## 2018-12-29 ENCOUNTER — Emergency Department (HOSPITAL_COMMUNITY)
Admission: EM | Admit: 2018-12-29 | Discharge: 2018-12-29 | Disposition: A | Discharge status: Home / Self Care | Payer: No Typology Code available for payment source | Attending: Emergency Medicine | Admitting: Emergency Medicine

## 2018-12-29 ENCOUNTER — Other Ambulatory Visit: Payer: Self-pay

## 2018-12-29 DIAGNOSIS — R51 Headache: Secondary | ICD-10-CM | POA: Diagnosis present

## 2018-12-29 DIAGNOSIS — M79672 Pain in left foot: Secondary | ICD-10-CM | POA: Diagnosis not present

## 2018-12-29 DIAGNOSIS — R112 Nausea with vomiting, unspecified: Secondary | ICD-10-CM | POA: Insufficient documentation

## 2018-12-29 DIAGNOSIS — M549 Dorsalgia, unspecified: Secondary | ICD-10-CM | POA: Diagnosis not present

## 2018-12-29 DIAGNOSIS — M542 Cervicalgia: Secondary | ICD-10-CM | POA: Diagnosis not present

## 2018-12-29 DIAGNOSIS — Z3042 Encounter for surveillance of injectable contraceptive: Secondary | ICD-10-CM | POA: Diagnosis not present

## 2018-12-29 MED ORDER — CYCLOBENZAPRINE HCL 10 MG PO TABS: 10.0000 mg | ORAL_TABLET | Freq: Two times a day (BID) | ORAL | 0 refills | Status: AC | PRN

## 2018-12-29 MED ORDER — IBUPROFEN 600 MG PO TABS: 600.0000 mg | ORAL_TABLET | Freq: Four times a day (QID) | ORAL | 0 refills | Status: AC | PRN

## 2018-12-29 MED ORDER — IBUPROFEN 800 MG PO TABS
800.0000 mg | ORAL_TABLET | Freq: Once | ORAL | Status: AC
  Administered 2018-12-29: 800 mg via ORAL
  Filled 2018-12-29: qty 1
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
## 2019-01-01 DIAGNOSIS — S20219D Contusion of unspecified front wall of thorax, subsequent encounter: Secondary | ICD-10-CM | POA: Diagnosis not present

## 2019-01-01 DIAGNOSIS — S060X0D Concussion without loss of consciousness, subsequent encounter: Secondary | ICD-10-CM | POA: Diagnosis not present

## 2019-01-05 ENCOUNTER — Other Ambulatory Visit: Payer: Self-pay | Admitting: Family Medicine

## 2019-01-09 ENCOUNTER — Other Ambulatory Visit: Payer: Self-pay | Admitting: Family Medicine

## 2019-01-09 DIAGNOSIS — S060X0D Concussion without loss of consciousness, subsequent encounter: Secondary | ICD-10-CM

## 2019-01-10 ENCOUNTER — Other Ambulatory Visit: Payer: Self-pay

## 2019-01-10 ENCOUNTER — Ambulatory Visit
Admission: RE | Admit: 2019-01-10 | Discharge: 2019-01-10 | Disposition: A | Discharge status: Home / Self Care | Payer: Self-pay | Source: Ambulatory Visit | Attending: Family Medicine | Admitting: Family Medicine

## 2019-01-10 DIAGNOSIS — S060X0D Concussion without loss of consciousness, subsequent encounter: Secondary | ICD-10-CM

## 2019-03-16 DIAGNOSIS — Z3042 Encounter for surveillance of injectable contraceptive: Secondary | ICD-10-CM | POA: Diagnosis not present

## 2019-04-12 DIAGNOSIS — E669 Obesity, unspecified: Secondary | ICD-10-CM | POA: Diagnosis not present

## 2019-04-12 DIAGNOSIS — Z713 Dietary counseling and surveillance: Secondary | ICD-10-CM | POA: Diagnosis not present

## 2019-04-12 DIAGNOSIS — L989 Disorder of the skin and subcutaneous tissue, unspecified: Secondary | ICD-10-CM | POA: Diagnosis not present

## 2019-05-24 DIAGNOSIS — M940 Chondrocostal junction syndrome [Tietze]: Secondary | ICD-10-CM | POA: Diagnosis not present

## 2019-06-01 DIAGNOSIS — Z3042 Encounter for surveillance of injectable contraceptive: Secondary | ICD-10-CM | POA: Diagnosis not present

## 2019-09-30 ENCOUNTER — Ambulatory Visit: Payer: Self-pay | Attending: Internal Medicine

## 2019-09-30 DIAGNOSIS — Z23 Encounter for immunization: Secondary | ICD-10-CM | POA: Insufficient documentation

## 2019-09-30 NOTE — Progress Notes (Signed)
   Covid-19 Vaccination Clinic  Name:  Jennifer Patton    MRN: [id number] DOB: Jul 26, 1991  09/30/2019  [name] was observed post Covid-19 immunization for 30 minutes based on pre-vaccination screening without incident. She was provided with Vaccine Information Sheet and instruction to access the V-Safe system.   [name] was instructed to call 911 with any severe reactions post vaccine: Marland Kitchen Difficulty breathing  . Swelling of face and throat  . A fast heartbeat  . A bad rash all over body  . Dizziness and weakness   Immunizations Administered    Name Date Dose VIS Date Route   Pfizer COVID-19 Vaccine 09/30/2019  5:59 PM 0.3 mL 07/06/2019 Intramuscular   Manufacturer: [employer]   Lot: [id number]   [place]: [id number]

## 2019-10-31 ENCOUNTER — Ambulatory Visit: Payer: Self-pay | Attending: Internal Medicine

## 2019-10-31 DIAGNOSIS — Z23 Encounter for immunization: Secondary | ICD-10-CM

## 2019-10-31 NOTE — Progress Notes (Signed)
   Covid-19 Vaccination Clinic  Name:  Jennifer Patton    MRN: [id number] DOB: 1991-05-23  10/31/2019  [name] was observed post Covid-19 immunization for 30 minutes based on pre-vaccination screening without incident. She was provided with Vaccine Information Sheet and instruction to access the V-Safe system.   [name] was instructed to call 911 with any severe reactions post vaccine: Marland Kitchen Difficulty breathing  . Swelling of face and throat  . A fast heartbeat  . A bad rash all over body  . Dizziness and weakness   Immunizations Administered    Name Date Dose VIS Date Route   Pfizer COVID-19 Vaccine 10/31/2019  3:51 PM 0.3 mL 07/06/2019 Intramuscular   Manufacturer: [place]   Lot: [id number]   [place]: [id number]

## 2020-05-10 ENCOUNTER — Ambulatory Visit: Payer: Self-pay

## 2020-05-24 ENCOUNTER — Ambulatory Visit: Payer: No Typology Code available for payment source | Attending: Internal Medicine

## 2020-05-24 DIAGNOSIS — Z23 Encounter for immunization: Secondary | ICD-10-CM

## 2020-05-24 NOTE — Progress Notes (Signed)
   Covid-19 Vaccination Clinic  Name:  LUPIE SAWA    MRN: [id number] DOB: Mar 20, 1991  05/24/2020  Ms. Bouie was observed post Covid-19 immunization for 15 minutes without incident. She was provided with Vaccine Information Sheet and instruction to access the V-Safe system.   Ms. Ueda was instructed to call 911 with any severe reactions post vaccine: Marland Kitchen Difficulty breathing  . Swelling of face and throat  . A fast heartbeat  . A bad rash all over body  . Dizziness and weakness

## 2020-07-21 ENCOUNTER — Other Ambulatory Visit: Payer: Self-pay | Admitting: Family Medicine

## 2020-07-21 DIAGNOSIS — N6320 Unspecified lump in the left breast, unspecified quadrant: Secondary | ICD-10-CM

## 2020-08-04 ENCOUNTER — Ambulatory Visit
Admission: RE | Admit: 2020-08-04 | Discharge: 2020-08-04 | Disposition: A | Discharge status: Home / Self Care | Payer: 59 | Source: Ambulatory Visit | Attending: Family Medicine | Admitting: Family Medicine

## 2020-08-04 ENCOUNTER — Other Ambulatory Visit: Payer: Self-pay

## 2020-08-04 DIAGNOSIS — N6320 Unspecified lump in the left breast, unspecified quadrant: Secondary | ICD-10-CM

## 2020-11-05 ENCOUNTER — Other Ambulatory Visit: Payer: Self-pay | Admitting: Family Medicine

## 2020-11-05 ENCOUNTER — Other Ambulatory Visit (HOSPITAL_COMMUNITY)
Admission: RE | Admit: 2020-11-05 | Discharge: 2020-11-05 | Disposition: A | Discharge status: Home / Self Care | Payer: 59 | Source: Ambulatory Visit | Attending: Family Medicine | Admitting: Family Medicine

## 2020-11-05 DIAGNOSIS — Z0000 Encounter for general adult medical examination without abnormal findings: Secondary | ICD-10-CM | POA: Insufficient documentation

## 2020-11-07 LAB — CYTOLOGY - PAP
Chlamydia: NEGATIVE
Comment: NEGATIVE
Diagnosis: NEGATIVE

## 2021-07-31 DIAGNOSIS — S76911A Strain of unspecified muscles, fascia and tendons at thigh level, right thigh, initial encounter: Secondary | ICD-10-CM | POA: Diagnosis not present

## 2021-07-31 DIAGNOSIS — R69 Illness, unspecified: Secondary | ICD-10-CM | POA: Diagnosis not present

## 2021-10-09 DIAGNOSIS — Z3042 Encounter for surveillance of injectable contraceptive: Secondary | ICD-10-CM | POA: Diagnosis not present

## 2021-11-20 DIAGNOSIS — Z83438 Family history of other disorder of lipoprotein metabolism and other lipidemia: Secondary | ICD-10-CM | POA: Diagnosis not present

## 2021-11-20 DIAGNOSIS — Z0000 Encounter for general adult medical examination without abnormal findings: Secondary | ICD-10-CM | POA: Diagnosis not present

## 2021-11-20 DIAGNOSIS — Z13220 Encounter for screening for lipoid disorders: Secondary | ICD-10-CM | POA: Diagnosis not present

## 2022-04-01 DIAGNOSIS — R000 Tachycardia, unspecified: Secondary | ICD-10-CM | POA: Diagnosis not present

## 2022-04-01 DIAGNOSIS — Z3042 Encounter for surveillance of injectable contraceptive: Secondary | ICD-10-CM | POA: Diagnosis not present

## 2022-04-01 DIAGNOSIS — N944 Primary dysmenorrhea: Secondary | ICD-10-CM | POA: Diagnosis not present

## 2022-09-27 DIAGNOSIS — Z3042 Encounter for surveillance of injectable contraceptive: Secondary | ICD-10-CM | POA: Diagnosis not present

## 2022-11-06 IMAGING — US US BREAST*L* LIMITED INC AXILLA
1 series · 6 of 6 positions shown · non-contrast
Comparison: None.

CLINICAL DATA: 29-year-old with a palpable lump in the UPPER
subareolar LEFT breast which she initially noticed approximately 1
month ago without interval change.

Patient states no family history of breast cancer.
EXAM:
ULTRASOUND OF THE LEFT BREAST

[Series 1: us breast*left* limited inc axilla · 0.06mm/px · 6 of 6 slices shown]
[im 1/6]
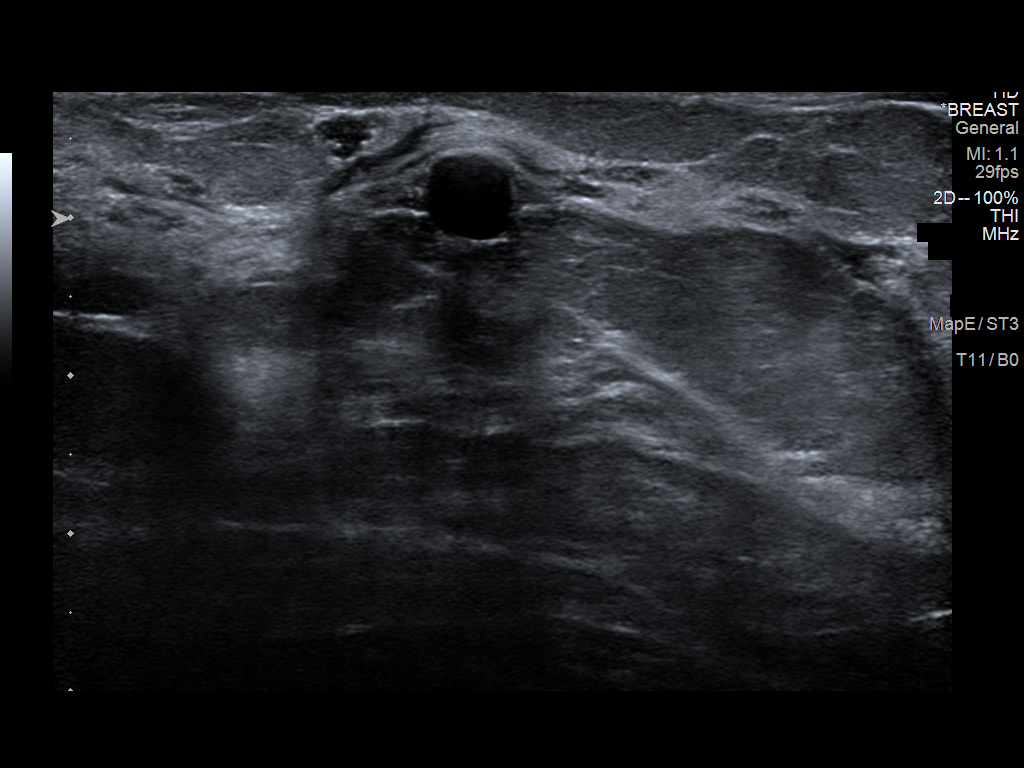
[im 2/6]
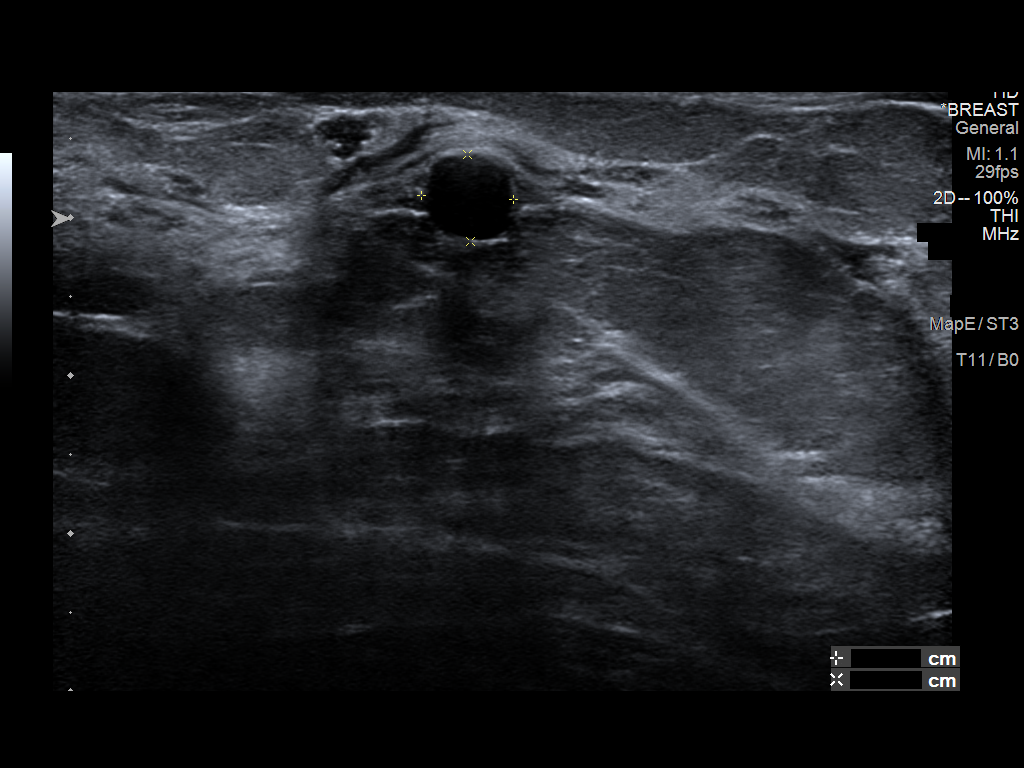
[im 3/6]
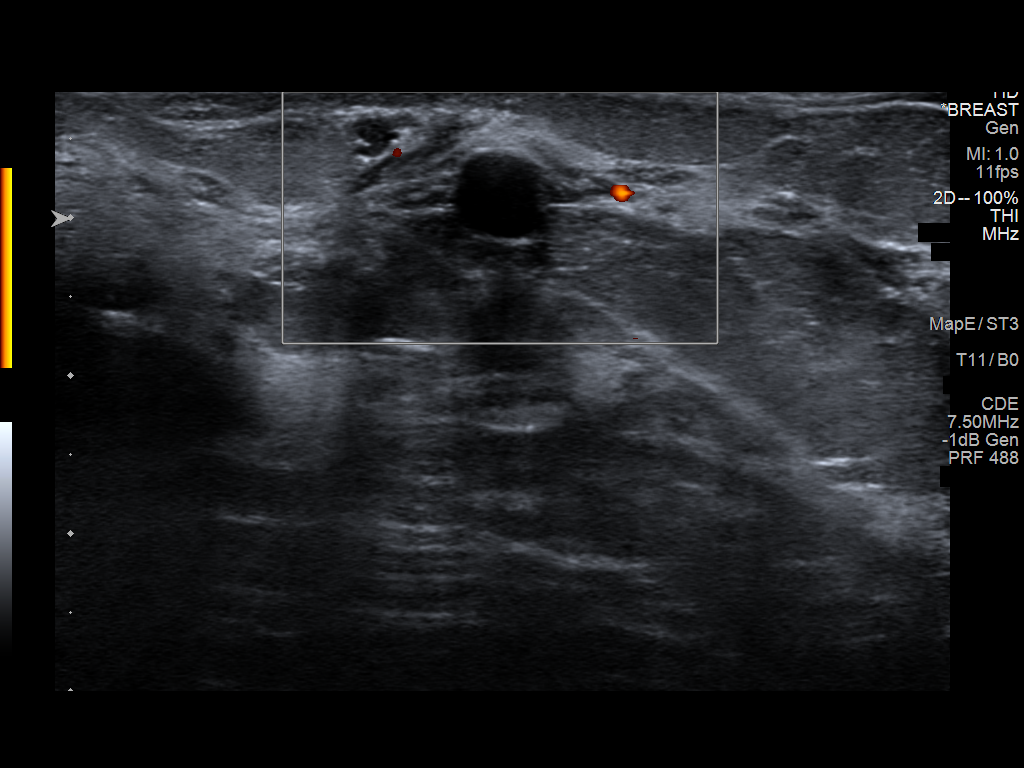
[im 4/6]
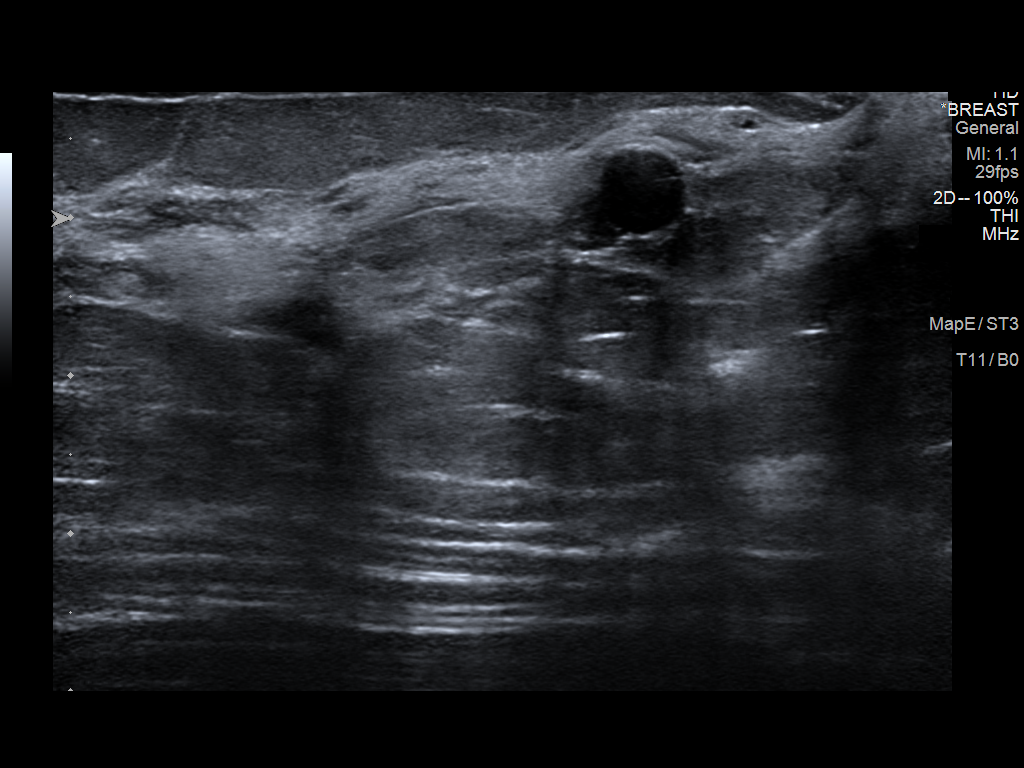
[im 5/6]
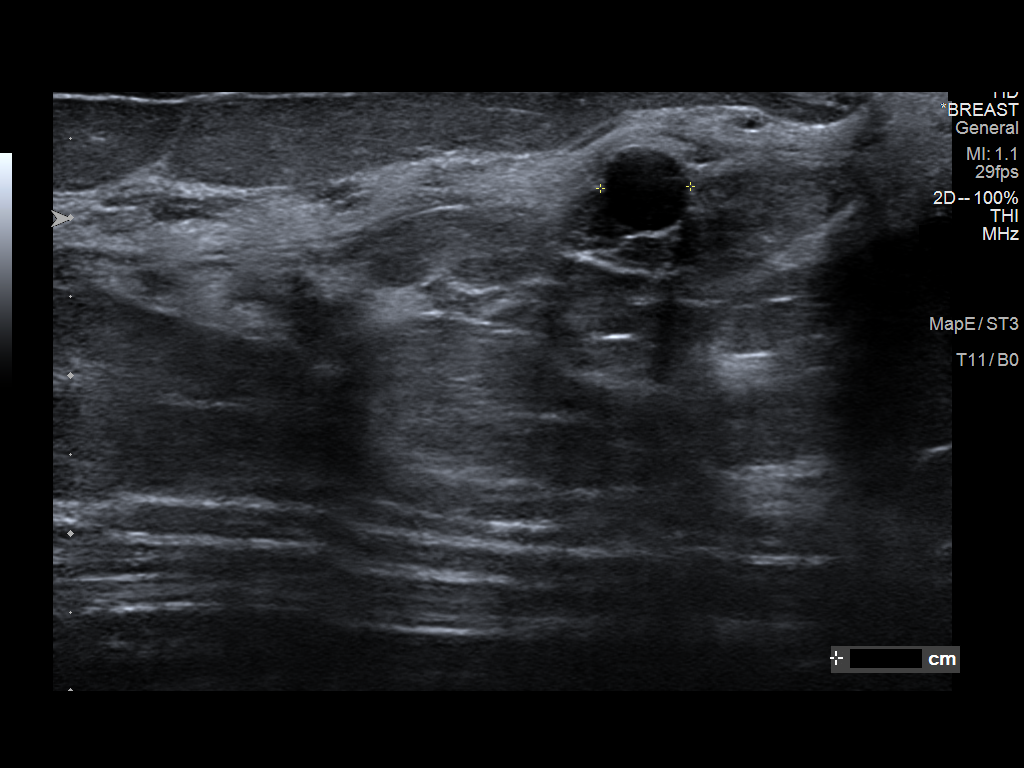
[im 6/6]
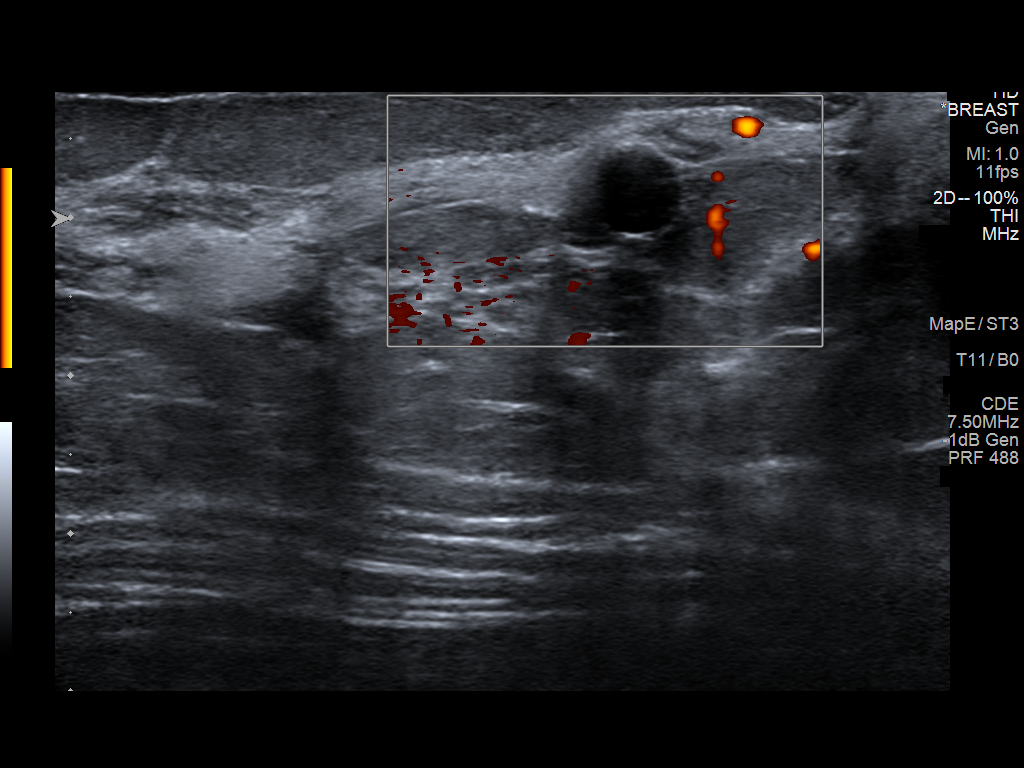

[6 of 6 positions shown; findings below may reference images not displayed]

FINDINGS: On correlative physical exam, there is a superficial palpable mobile
pea-sized lump in the UPPER subareolar location of the LEFT breast.

Targeted ultrasound is performed, showing a benign simple cyst
superficially at the 12:30 o'clock subareolar location measuring
approximately 6 mm diameter, demonstrating posterior acoustic
enhancement and no internal power Doppler flow, corresponding to the
palpable concern. No suspicious solid mass or abnormal acoustic
shadowing is identified.
IMPRESSION: Benign 6 mm simple cyst involving the UPPER subareolar LEFT breast
which accounts for the palpable concern.

RECOMMENDATION:
Screening mammogram at age 40 unless there are persistent or
intervening clinical concerns. (Code:[id number])

I have discussed the findings and recommendations with the patient.
If applicable, a reminder letter will be sent to the patient
regarding the next appointment.

BI-RADS CATEGORY  2: Benign.

## 2022-11-29 DIAGNOSIS — E7800 Pure hypercholesterolemia, unspecified: Secondary | ICD-10-CM | POA: Diagnosis not present

## 2022-11-29 DIAGNOSIS — F320 Major depressive disorder, single episode, mild: Secondary | ICD-10-CM | POA: Diagnosis not present

## 2022-12-13 DIAGNOSIS — Z3042 Encounter for surveillance of injectable contraceptive: Secondary | ICD-10-CM | POA: Diagnosis not present

## 2023-03-01 DIAGNOSIS — Z3042 Encounter for surveillance of injectable contraceptive: Secondary | ICD-10-CM | POA: Diagnosis not present

## 2023-05-17 DIAGNOSIS — Z3042 Encounter for surveillance of injectable contraceptive: Secondary | ICD-10-CM | POA: Diagnosis not present

## 2023-07-01 DIAGNOSIS — G43901 Migraine, unspecified, not intractable, with status migrainosus: Secondary | ICD-10-CM | POA: Diagnosis not present

## 2023-07-01 DIAGNOSIS — R112 Nausea with vomiting, unspecified: Secondary | ICD-10-CM | POA: Diagnosis not present

## 2023-07-12 DIAGNOSIS — N944 Primary dysmenorrhea: Secondary | ICD-10-CM | POA: Diagnosis not present

## 2023-07-12 DIAGNOSIS — F419 Anxiety disorder, unspecified: Secondary | ICD-10-CM | POA: Diagnosis not present

## 2023-07-12 DIAGNOSIS — F5101 Primary insomnia: Secondary | ICD-10-CM | POA: Diagnosis not present

## 2023-07-12 DIAGNOSIS — G43109 Migraine with aura, not intractable, without status migrainosus: Secondary | ICD-10-CM | POA: Diagnosis not present

## 2023-08-02 DIAGNOSIS — Z3042 Encounter for surveillance of injectable contraceptive: Secondary | ICD-10-CM | POA: Diagnosis not present

## 2023-10-18 DIAGNOSIS — Z3042 Encounter for surveillance of injectable contraceptive: Secondary | ICD-10-CM | POA: Diagnosis not present

## 2023-12-05 DIAGNOSIS — Z0000 Encounter for general adult medical examination without abnormal findings: Secondary | ICD-10-CM | POA: Diagnosis not present

## 2023-12-05 DIAGNOSIS — G441 Vascular headache, not elsewhere classified: Secondary | ICD-10-CM | POA: Diagnosis not present

## 2023-12-05 DIAGNOSIS — F419 Anxiety disorder, unspecified: Secondary | ICD-10-CM | POA: Diagnosis not present

## 2023-12-05 DIAGNOSIS — E669 Obesity, unspecified: Secondary | ICD-10-CM | POA: Diagnosis not present

## 2023-12-05 DIAGNOSIS — E7800 Pure hypercholesterolemia, unspecified: Secondary | ICD-10-CM | POA: Diagnosis not present

## 2024-01-03 DIAGNOSIS — Z3042 Encounter for surveillance of injectable contraceptive: Secondary | ICD-10-CM | POA: Diagnosis not present

## 2024-01-23 DIAGNOSIS — R102 Pelvic and perineal pain: Secondary | ICD-10-CM | POA: Diagnosis not present

## 2024-01-23 DIAGNOSIS — R350 Frequency of micturition: Secondary | ICD-10-CM | POA: Diagnosis not present

## 2024-01-23 DIAGNOSIS — M2540 Effusion, unspecified joint: Secondary | ICD-10-CM | POA: Diagnosis not present

## 2024-01-23 DIAGNOSIS — M2550 Pain in unspecified joint: Secondary | ICD-10-CM | POA: Diagnosis not present

## 2024-02-07 DIAGNOSIS — N946 Dysmenorrhea, unspecified: Secondary | ICD-10-CM | POA: Diagnosis not present

## 2024-02-07 DIAGNOSIS — N949 Unspecified condition associated with female genital organs and menstrual cycle: Secondary | ICD-10-CM | POA: Diagnosis not present

## 2024-02-21 DIAGNOSIS — N949 Unspecified condition associated with female genital organs and menstrual cycle: Secondary | ICD-10-CM | POA: Diagnosis not present

## 2024-02-21 DIAGNOSIS — F419 Anxiety disorder, unspecified: Secondary | ICD-10-CM | POA: Diagnosis not present

## 2024-02-21 DIAGNOSIS — N946 Dysmenorrhea, unspecified: Secondary | ICD-10-CM | POA: Diagnosis not present

## 2024-02-24 DIAGNOSIS — F419 Anxiety disorder, unspecified: Secondary | ICD-10-CM | POA: Diagnosis not present

## 2024-02-24 DIAGNOSIS — N946 Dysmenorrhea, unspecified: Secondary | ICD-10-CM | POA: Diagnosis not present

## 2024-02-24 DIAGNOSIS — N949 Unspecified condition associated with female genital organs and menstrual cycle: Secondary | ICD-10-CM | POA: Diagnosis not present

## 2024-03-06 DIAGNOSIS — N949 Unspecified condition associated with female genital organs and menstrual cycle: Secondary | ICD-10-CM | POA: Diagnosis not present

## 2024-03-06 DIAGNOSIS — F419 Anxiety disorder, unspecified: Secondary | ICD-10-CM | POA: Diagnosis not present

## 2024-03-06 DIAGNOSIS — F5101 Primary insomnia: Secondary | ICD-10-CM | POA: Diagnosis not present

## 2024-03-22 DIAGNOSIS — Z3042 Encounter for surveillance of injectable contraceptive: Secondary | ICD-10-CM | POA: Diagnosis not present

## 2024-04-19 DIAGNOSIS — F5101 Primary insomnia: Secondary | ICD-10-CM | POA: Diagnosis not present

## 2024-04-19 DIAGNOSIS — N949 Unspecified condition associated with female genital organs and menstrual cycle: Secondary | ICD-10-CM | POA: Diagnosis not present

## 2024-04-19 DIAGNOSIS — G441 Vascular headache, not elsewhere classified: Secondary | ICD-10-CM | POA: Diagnosis not present
